# Patient Record
Sex: Female | Born: 1990 | Race: Black or African American | Hispanic: No | Marital: Single | Smoking: Never smoker
Health system: Southern US, Community
[De-identification: ages and names within clinical notes are randomized; demographics above are authoritative.]

---

## 2017-10-07 ENCOUNTER — Emergency Department (HOSPITAL_COMMUNITY)
Admission: EM | Admit: 2017-10-07 | Discharge: 2017-10-07 | Disposition: A | Discharge status: Home / Self Care | Payer: Self-pay | Attending: Emergency Medicine | Admitting: Emergency Medicine

## 2017-10-07 ENCOUNTER — Emergency Department (HOSPITAL_COMMUNITY): Payer: Self-pay

## 2017-10-07 ENCOUNTER — Encounter (HOSPITAL_COMMUNITY): Payer: Self-pay | Admitting: *Deleted

## 2017-10-07 DIAGNOSIS — R519 Headache, unspecified: Secondary | ICD-10-CM

## 2017-10-07 DIAGNOSIS — R51 Headache: Secondary | ICD-10-CM | POA: Insufficient documentation

## 2017-10-07 LAB — CBG MONITORING, ED: Glucose-Capillary: 98 mg/dL (ref 70–99)

## 2017-10-07 LAB — POC URINE PREG, ED: Preg Test, Ur: NEGATIVE

## 2017-10-07 MED ORDER — ONDANSETRON 4 MG PO TBDP: 4.0000 mg | ORAL_TABLET | Freq: Three times a day (TID) | ORAL | 0 refills | Status: DC | PRN

## 2017-10-07 MED ORDER — ONDANSETRON HCL 4 MG/2ML IJ SOLN
4.0000 mg | Freq: Once | INTRAMUSCULAR | Status: AC
  Administered 2017-10-07: 4 mg via INTRAVENOUS
  Filled 2017-10-07: qty 2

## 2017-10-07 MED ORDER — PROCHLORPERAZINE EDISYLATE 10 MG/2ML IJ SOLN
10.0000 mg | Freq: Once | INTRAMUSCULAR | Status: AC
  Administered 2017-10-07: 10 mg via INTRAVENOUS
  Filled 2017-10-07: qty 2

## 2017-10-07 MED ORDER — KETOROLAC TROMETHAMINE 15 MG/ML IJ SOLN
15.0000 mg | Freq: Once | INTRAMUSCULAR | Status: AC
  Administered 2017-10-07: 15 mg via INTRAVENOUS
  Filled 2017-10-07: qty 1

## 2017-10-07 MED ORDER — DIPHENHYDRAMINE HCL 50 MG/ML IJ SOLN
12.5000 mg | Freq: Once | INTRAMUSCULAR | Status: AC
  Administered 2017-10-07: 12.5 mg via INTRAVENOUS
  Filled 2017-10-07: qty 1

## 2017-10-07 MED ORDER — ACETAMINOPHEN 500 MG PO TABS
500.0000 mg | ORAL_TABLET | Freq: Once | ORAL | Status: DC
  Filled 2017-10-07: qty 1

## 2017-10-07 MED ORDER — ONDANSETRON 4 MG PO TBDP
4.0000 mg | ORAL_TABLET | Freq: Once | ORAL | Status: AC
  Administered 2017-10-07: 4 mg via ORAL
  Filled 2017-10-07: qty 1

## 2017-10-07 MED ORDER — SODIUM CHLORIDE 0.9 % IV BOLUS
1000.0000 mL | Freq: Once | INTRAVENOUS | Status: AC
  Administered 2017-10-07: 1000 mL via INTRAVENOUS

## 2017-10-07 NOTE — Discharge Instructions (Addendum)
Please return to the Emergency Department for any new or worsening symptoms or if your symptoms do not improve. Please be sure to follow up with your Primary Care Physician as soon as possible regarding your visit today. If you do not have a Primary Doctor please use the resources below to establish one. You may use the zofran as prescribed for nausea. Please call [facility] to follow-up for further evaluation of your headache. Please drink plenty of water and get plenty of rest to help with your headache.  You may also use over-the-counter anti-inflammatories such as Tylenol or ibuprofen as directed on the packaging to help with your pain.  Contact a health care provider if: Your symptoms are not helped by medicine. You have a headache that is different from the usual headache. You have nausea or you vomit. You have a fever. Get help right away if: Your headache becomes severe. You have repeated vomiting. You have a stiff neck. You have a loss of vision. You have problems with speech. You have pain in the eye or ear. You have muscular weakness or loss of muscle control. You lose your balance or have trouble walking. You feel faint or pass out. You have confusion.  RESOURCE GUIDE  Chronic Pain Problems: Contact [facility]  [phone] Patients need to be referred by their primary care doctor.  Insufficient Money for Medicine: Contact [employer]:  call "211" or [place] [phone].  No Primary Care Doctor: Call [facility]  [phone] - can help you locate a primary care doctor that  accepts your insurance, provides certain services, etc. Physician Referral Service- [phone]  Agencies that provide inexpensive medical care: [facility]  [facility]  [phone] [facility]  [phone] [facility]  [phone] Planned Parenthood  [phone] [facility]   [phone]  [place] Providers: [facility]- [address], [address]  [phone], Mon-Fri 9am-7pm, Sat 9am-1pm [place], [address] [place]  [place], [address] [place]  [place]- [address]  [place], [address], [phone]  Only accepts [place] patients after they have their name  applied to their card  Self Pay (no insurance) in [facility]: Sickle Cell Patients: Dr Kevan Ny, [facility]  [place], [facility]- [place]  [facility] [place]- [address], [facility]- see information above (Speak to [employer] if you do not have insurance)       -  [facility]- [place], [place],  [place]       -  [place], [place]  Dr Vista Lawman-  [address], [address], [place], [facility]- [address] [phone]       -  [facility] [place]- [address], [place], also [address] [phone]       -  [place], [place], 1st & 3rd Saturday   every month, 10am-1pm  1) Find a Doctor and Pay Out of Pocket Although you won't have to find out who is covered by your insurance plan, it is a good idea to ask around and get recommendations. You will then need to call the office and see if the doctor you have chosen will accept you as a new patient and what types of options they offer for patients who are self-pay. Some doctors offer discounts or will set up payment plans for their patients who do not have insurance, but you will need to ask so you aren't surprised when  you get to your appointment.  2) Contact Your Local Health Department Not all health departments have doctors that can see patients for sick visits, but many do, so it is worth a call to see if yours does. If you don't know where your local health department is, you can check in your phone book. The CDC also has a tool to help you locate your state's health department, and many state websites also have listings of all of their local health departments.  3) Find a [facility] If your illness is not likely to be very severe or complicated, you may want to try a walk in clinic. These are popping up all over the country in pharmacies, drugstores, and shopping centers. They're usually staffed by nurse practitioners or physician assistants that have been trained to treat common illnesses and complaints. They're usually fairly quick and inexpensive. However, if you have serious medical issues or chronic medical problems, these are probably not your best option  [facility] [place], [facility], [address] [place], phone [phone] or [phone].  Monday - Friday, call for an appointment. [place], [facility], [address], [place], phone [phone] or [phone].  Monday - Friday, call for an appointment.  Abuse/Neglect: [place] [phone] [place] [phone] (After Hours)  Emergency Shelter:  [facility] [phone]  Maternity Homes: [facility] [phone] [place] [phone]  MRSA Hotline #:   936-107-7084  [facility] Dept. [address]         [place] Phone:  [phone]                                  Phone:  [phone]                   Phone:  [place], [phone]       -  [facility] in [place], [address]                                  [place] [phone] or [phone] (After Hours)   [place]  Substance Abuse Resources: Alcohol and Drug Services  [place] [phone] [facility] [place] Chinita Pester [phone] Residential & Outpatient Substance Abuse Program  [phone]  Psychological Services: [place]  [phone] [place]  [place], [place] [address] [place], [place]: [phone] or [phone], [employer]  Dental Assistance  If unable to pay or uninsured, contact:  [facility] or [facility]. to become qualified for the adult dental clinic.  Patients with Medicaid: [facility] [phone] [address], [place] [address], [phone]  If unable to pay, or uninsured, contact [facility] [phone]) or [place] [phone] in [place], [place] in [facility]) to become qualified for the adult dental clinic   Other [place]- [place], [place], [place], [address], [place], [place], 2nd and 4th Thursday of the month at 6:30am.  10 clients each day by appointment, can sometimes see walk-in patients if someone does not show for an appointment. [facility]- [address] Hillard Danker [place], [place], [address], [place], [place], [place], [address],  [place] Department- [phone] [facility] [phone]

## 2017-10-07 NOTE — ED Triage Notes (Signed)
Pt in stating this morning while at work developed headache, blurred vision, photophobia and fatigue, alert and oriented, answering questions appropriately, pt did eat breakfast, no history of migraines in the past, pt also reports nausea, denies vomiting or diarrhea

## 2017-10-07 NOTE — ED Triage Notes (Signed)
Pt requesting medication for nausea 

## 2017-10-07 NOTE — ED Provider Notes (Signed)
[facility] EMERGENCY DEPARTMENT Provider Note   CSN: [id number] Arrival date & time: 10/07/17  6378     History   Chief Complaint Chief Complaint  Patient presents with  . Headache  . Vomiting    HPI Amanda Banks is a 27 y.o. female presenting for sudden onset that began at 7 AM this morning.  Patient describes her headache as a frontal sharp/throbbing pain that is 10/10 in severity and constant.  Patient states that she experienced a short episode of blurred vision when attempting to look at her friend's phone this morning that lasted less than 1 minute.  Patient endorses intermittent nausea and vomiting, vomited x2 today nonbloody nonbilious.  Patient also endorses right arm numbness that lasted less than 1 minute this morning. Patient states that she has had headaches in the past, no diagnosis of migraines.  Patient states that she has never had a headache this severe in the past.  Patient denies neck pain/stiffness, fever.  At time of evaluation patient only endorsing headache, denies numbness, tingling, weakness or vision abnormalities.  CT scan ordered by triage with no acute abnormalities.  HPI  History reviewed. No pertinent past medical history.  There are no active problems to display for this patient.   History reviewed. No pertinent surgical history.   OB History   None      Home Medications    Prior to Admission medications   Medication Sig Start Date End Date Taking? Authorizing Provider  ondansetron (ZOFRAN ODT) 4 MG disintegrating tablet Take 1 tablet (4 mg total) by mouth every 8 (eight) hours as needed for nausea or vomiting. 10/07/17   Deliah Boston, PA-C    Family History History reviewed. No pertinent family history.  Social History Social History   Tobacco Use  . Smoking status: Never Smoker  . Smokeless tobacco: Never Used  Substance Use Topics  . Alcohol use: Not on file  . Drug use: Not on file      Allergies   Patient has no known allergies.   Review of Systems Review of Systems  Constitutional: Negative.  Negative for chills, fatigue and fever.  Eyes: Positive for photophobia and visual disturbance. Negative for pain.  Gastrointestinal: Positive for nausea and vomiting. Negative for abdominal pain.  Musculoskeletal: Negative.  Negative for neck pain and neck stiffness.  Neurological: Positive for numbness and headaches. Negative for dizziness, syncope, speech difficulty and weakness.  All other systems reviewed and are negative.   Physical Exam Updated Vital Signs BP 117/66 (BP Location: Right Arm)   Pulse 74   Temp 97.8 F (36.6 C) (Oral)   Resp 20   SpO2 100%   Physical Exam  Constitutional: She is oriented to person, place, and time. She appears well-developed and well-nourished. She does not appear ill. No distress.  HENT:  Head: Normocephalic and atraumatic.  Right Ear: External ear normal.  Left Ear: External ear normal.  Nose: Nose normal.  Mouth/Throat: Oropharynx is clear and moist. No oropharyngeal exudate.  Eyes: Pupils are equal, round, and reactive to light. Conjunctivae and EOM are normal.  Neck: Trachea normal, normal range of motion and full passive range of motion without pain. Neck supple. No tracheal deviation present. No Brudzinski's sign noted.  Cardiovascular: Normal rate, regular rhythm, normal heart sounds and intact distal pulses.  Pulmonary/Chest: Effort normal and breath sounds normal. No respiratory distress.  Abdominal: Soft. Bowel sounds are normal. There is no tenderness. There is no rebound and no  guarding.  Musculoskeletal: Normal range of motion. She exhibits no tenderness.  Neurological: She is alert and oriented to person, place, and time. She has normal strength. No cranial nerve deficit or sensory deficit. She displays a negative Romberg sign. Coordination and gait normal. GCS eye subscore is 4. GCS verbal subscore is 5. GCS  motor subscore is 6.  Mental Status: Alert, oriented, thought content appropriate, able to give a coherent history. Speech fluent without evidence of aphasia. Able to follow 2 step commands without difficulty. Cranial Nerves: II: Peripheral visual fields grossly normal, pupils equal, round, reactive to light III,IV, VI: ptosis not present, extra-ocular motions intact bilaterally V,VII: smile symmetric, eyebrows raise symmetric, facial light touch sensation equal VIII: hearing grossly normal to voice X: uvula elevates symmetrically XI: bilateral shoulder shrug symmetric and strong XII: midline tongue extension without fassiculations Motor: Normal tone. 5/5 strength in upper and lower extremities bilaterally including strong and equal grip strength and dorsiflexion/plantar flexion Sensory: Sensation intact to light touch in all extremities.Negative Romberg.  Cerebellar: normal finger-to-nose with bilateral upper extremities. Normal heel-to -shin balance bilaterally of the lower extremity. No pronator drift.  Gait: normal gait and balance CV: distal pulses palpable throughout  Skin: Skin is warm and dry. Capillary refill takes less than 2 seconds.  Psychiatric: She has a normal mood and affect. Her behavior is normal.    ED Treatments / Results  Labs (all labs ordered are listed, but only abnormal results are displayed) Labs Reviewed  CBG MONITORING, ED  POC URINE PREG, ED    EKG None  Radiology Ct Head Wo Contrast  Result Date: 10/07/2017 CLINICAL DATA:  Headache and blurred vision. History of pituitary tumor. EXAM: CT HEAD WITHOUT CONTRAST TECHNIQUE: Contiguous axial images were obtained from the base of the skull through the vertex without intravenous contrast. COMPARISON:  None. FINDINGS: Brain: There is no evidence of acute infarct, intracranial hemorrhage, mass, midline shift, or extra-axial fluid collection. The ventricles and sulci are normal. The pituitary gland  itself is obscured by beam hardening, however no grossly expansile or destructive sellar mass is evident. Vascular: No hyperdense vessel. Skull: No fracture or focal osseous lesion. Sinuses/Orbits: Extensive bilateral ethmoid air cell opacification. Right greater than left sphenoid sinus mucosal thickening and secretions. Clear mastoid air cells. Unremarkable orbits. Other: None. IMPRESSION: 1. No evidence of acute intracranial abnormality. 2. Extensive bilateral ethmoid and sphenoid sinus inflammatory disease. Electronically Signed   By: Logan Bores M.D.   On: 10/07/2017 11:24    Procedures Procedures (including critical care time)  Medications Ordered in ED Medications  ondansetron (ZOFRAN-ODT) disintegrating tablet 4 mg (4 mg Oral Given 10/07/17 0904)  sodium chloride 0.9 % bolus 1,000 mL (0 mLs Intravenous Stopped 10/07/17 1447)  ketorolac (TORADOL) 15 MG/ML injection 15 mg (15 mg Intravenous Given 10/07/17 1333)  prochlorperazine (COMPAZINE) injection 10 mg (10 mg Intravenous Given 10/07/17 1334)  diphenhydrAMINE (BENADRYL) injection 12.5 mg (12.5 mg Intravenous Given 10/07/17 1336)  ondansetron (ZOFRAN) injection 4 mg (4 mg Intravenous Given 10/07/17 1613)     Initial Impression / Assessment and Plan / ED Course  I have reviewed the triage vital signs and the nursing notes.  Pertinent labs & imaging results that were available during my care of the patient were reviewed by me and considered in my medical decision making (see chart for details).  Clinical Course as of Oct 08 2023  Tue Oct 07, 2017  1455 On reevaluation patient sitting comfortably in bed no acute distress.  Patient states that her headache has improved at this time.   [BM]    Clinical Course User Index [BM] Deliah Boston, PA-C   Amanda Banks is a 27 y.o. female who presents to ED for headache. No focal neuro deficits on exam. Migraine cocktail and fluids given, headache treated and improved while in ED.    On  re-evaluation, patient denying pain, nausea or vomiting tolerating p.o. fluids without difficulty. The patient denies any neurologic symptoms such as visual changes, focal numbness/weakness, balance problems, confusion, or speech difficulty to suggest a life-threatening intracranial process such as intracranial hemorrhage or mass. The patient has no clotting risk factors thus venous sinus thrombosis is unlikely. No fevers, neck pain or nuchal rigidity to suggest meningitis. Patient is afebrile, non-toxic and well appearing. Reassuring neuro exam, normal gait in the hallway. No cranial deficits, no speech deficits, negative pronator drift, normal/equal strength to all extremities.   CT head without acute findings.  I feel that the patient is safe for discharge home at this time. PCP follow up strongly encouraged. I have reviewed return precautions including development of fever, nausea/vomiting or neurologic symptoms, vision changes, confusion, lethargy, difficulty speaking/walking, or other new/worsening/concerning symptoms. Patient states understanding of return precautions. Patient is agreeable to discharge. All questions answered.  Patient is also been given referral to [facility] neurology for further evaluation.  At this time there does not appear to be any evidence of an acute emergency medical condition and the patient appears stable for discharge with appropriate outpatient follow up. Diagnosis was discussed with patient who verbalizes understanding of care plan and is agreeable to discharge. I have discussed return precautions with patient and family at bedside who verbalize understanding of return precautions. Patient strongly encouraged to follow-up with their PCP. All questions answered.  Patient's case discussed with Dr. Rogene Houston who agrees with plan to discharge with follow-up.     Note: Portions of this report may have been transcribed using voice recognition software. Every effort was  made to ensure accuracy; however, inadvertent computerized transcription errors may still be present.    Final Clinical Impressions(s) / ED Diagnoses   Final diagnoses:  Bad headache    ED Discharge Orders         Ordered    ondansetron (ZOFRAN ODT) 4 MG disintegrating tablet  Every 8 hours PRN     10/07/17 1624           Gari Crown 10/07/17 2031    Fredia Sorrow, MD 10/12/17 [phone]

## 2017-10-07 NOTE — ED Notes (Signed)
Declined W/C at D/C and was escorted to lobby by RN. 

## 2017-10-07 NOTE — ED Triage Notes (Signed)
Pt took ibuprofen around 8am with no improvement, headache started at 7a, sudden onset and was severe from beginning

## 2019-07-27 ENCOUNTER — Ambulatory Visit
Admission: EM | Admit: 2019-07-27 | Discharge: 2019-07-27 | Disposition: A | Discharge status: Home / Self Care | Payer: No Typology Code available for payment source | Attending: Emergency Medicine | Admitting: Emergency Medicine

## 2019-07-27 DIAGNOSIS — R1031 Right lower quadrant pain: Secondary | ICD-10-CM | POA: Diagnosis present

## 2019-07-27 LAB — POCT URINALYSIS DIP (MANUAL ENTRY)
Bilirubin, UA: NEGATIVE
Glucose, UA: NEGATIVE mg/dL
Ketones, POC UA: NEGATIVE mg/dL
Leukocytes, UA: NEGATIVE
Nitrite, UA: NEGATIVE
Protein Ur, POC: NEGATIVE mg/dL
Spec Grav, UA: 1.02 (ref 1.010–1.025)
Urobilinogen, UA: 0.2 E.U./dL
pH, UA: 5 (ref 5.0–8.0)

## 2019-07-27 LAB — POCT URINE PREGNANCY: Preg Test, Ur: NEGATIVE

## 2019-07-27 NOTE — ED Provider Notes (Signed)
[facility]    CSN: [id number] Arrival date & time: 07/27/19  1205      History   Chief Complaint Chief Complaint  Patient presents with  . Abdominal Pain    RLQ    HPI Amanda Banks is a 29 y.o. female.   Patient presents with 2-week history of right lower quadrant pain.  She states that the pain is intermittent; no aggravating or alleviating factors; 5/10; sharp.  She also reports a small amount of clear thin vaginal discharge x several weeks.  She denies fever, chills, shortness of breath, nausea, vomiting, diarrhea, constipation, change in appetite, pelvic pain, rash, lesions, or other symptoms.  No treatments attempted.  The history is provided by the patient.    History reviewed. No pertinent past medical history.  There are no problems to display for this patient.   History reviewed. No pertinent surgical history.  OB History   No obstetric history on file.      Home Medications    Prior to Admission medications   Medication Sig Start Date End Date Taking? Authorizing Provider  ondansetron (ZOFRAN ODT) 4 MG disintegrating tablet Take 1 tablet (4 mg total) by mouth every 8 (eight) hours as needed for nausea or vomiting. 10/07/17   Deliah Boston, PA-C    Family History History reviewed. No pertinent family history.  Social History Social History   Tobacco Use  . Smoking status: Never Smoker  . Smokeless tobacco: Never Used  Substance Use Topics  . Alcohol use: Not on file  . Drug use: Not on file     Allergies   Patient has no known allergies.   Review of Systems Review of Systems  Constitutional: Negative for chills and fever.  HENT: Negative for ear pain and sore throat.   Eyes: Negative for pain and visual disturbance.  Respiratory: Negative for cough and shortness of breath.   Cardiovascular: Negative for chest pain and palpitations.  Gastrointestinal: Positive for abdominal pain. Negative for constipation, diarrhea, nausea  and vomiting.  Genitourinary: Positive for vaginal discharge. Negative for dysuria, flank pain, frequency, hematuria and urgency.  Musculoskeletal: Negative for arthralgias and back pain.  Skin: Negative for color change and rash.  Neurological: Negative for seizures and syncope.  All other systems reviewed and are negative.    Physical Exam Triage Vital Signs ED Triage Vitals  Enc Vitals Group     BP      Pulse      Resp      Temp      Temp src      SpO2      Weight      Height      Head Circumference      Peak Flow      Pain Score      Pain Loc      Pain Edu?      Excl. in [place]?    No data found.  Updated Vital Signs BP 120/76   Pulse (!) 102   Temp 99.7 F (37.6 C)   Resp 16   LMP 06/12/2019 (Exact Date)   SpO2 100%   Visual Acuity Right Eye Distance:   Left Eye Distance:   Bilateral Distance:    Right Eye Near:   Left Eye Near:    Bilateral Near:     Physical Exam Vitals and nursing note reviewed.  Constitutional:      General: She is not in acute distress.    Appearance:  She is well-developed. She is not ill-appearing.  HENT:     Head: Normocephalic and atraumatic.     Mouth/Throat:     Mouth: Mucous membranes are moist.     Pharynx: Oropharynx is clear.  Eyes:     Conjunctiva/sclera: Conjunctivae normal.  Cardiovascular:     Rate and Rhythm: Normal rate and regular rhythm.     Heart sounds: No murmur heard.   Pulmonary:     Effort: Pulmonary effort is normal. No respiratory distress.     Breath sounds: Normal breath sounds.  Abdominal:     General: Bowel sounds are normal. There is no distension.     Palpations: Abdomen is soft.     Tenderness: There is no abdominal tenderness. There is no right CVA tenderness, left CVA tenderness, guarding or rebound.  Musculoskeletal:     Cervical back: Neck supple.     Right lower leg: No edema.     Left lower leg: No edema.  Skin:    General: Skin is warm and dry.     Findings: No rash.    Neurological:     General: No focal deficit present.     Mental Status: She is alert and oriented to person, place, and time.     Gait: Gait normal.  Psychiatric:        Mood and Affect: Mood normal.        Behavior: Behavior normal.      UC Treatments / Results  Labs (all labs ordered are listed, but only abnormal results are displayed) Labs Reviewed  POCT URINALYSIS DIP (MANUAL ENTRY) - Abnormal; Notable for the following components:      Result Value   Blood, UA trace-intact (*)    All other components within normal limits  CBC  COMPREHENSIVE METABOLIC PANEL  POCT URINE PREGNANCY  CERVICOVAGINAL ANCILLARY ONLY    EKG   Radiology No results found.  Procedures Procedures (including critical care time)  Medications Ordered in UC Medications - No data to display  Initial Impression / Assessment and Plan / UC Course  I have reviewed the triage vital signs and the nursing notes.  Pertinent labs & imaging results that were available during my care of the patient were reviewed by me and considered in my medical decision making (see chart for details).   Right lower quadrant abdominal pain.  Discussed limitations of UC versus ED.  Patient declines transfer to the ED at this time.  She is well-appearing and her exam is reassuring.  CBC and CMP pending.  Urine negative for nitrites and LE.  Urine pregnancy negative.  Strict precautions for going to the ED discussed with patient.  Instructed her to follow-up with her PCP as soon as possible.  Patient agrees to plan of care.   Final Clinical Impressions(s) / UC Diagnoses   Final diagnoses:  RLQ abdominal pain     Discharge Instructions     Go to the emergency department if you have acute abdominal pain.    Your lab tests are pending.  We will call you with the results.    Your vaginal test are pending.  Do not have sex until the test results are back.  You may require treatment at that time.    Follow-up with your  primary care provider.        ED Prescriptions    None     PDMP not reviewed this encounter.   Sharion Balloon, NP 07/27/19 1255

## 2019-07-27 NOTE — Discharge Instructions (Signed)
Go to the emergency department if you have acute abdominal pain.    Your lab tests are pending.  We will call you with the results.    Your vaginal test are pending.  Do not have sex until the test results are back.  You may require treatment at that time.    Follow-up with your primary care provider.

## 2019-07-27 NOTE — ED Triage Notes (Signed)
Patient reports throbbing RLQ pain x2 week. Pain is intermittent; states nothing makes it better or worse.

## 2019-07-28 ENCOUNTER — Telehealth (HOSPITAL_COMMUNITY): Payer: Self-pay | Admitting: Orthopedic Surgery

## 2019-07-28 LAB — COMPREHENSIVE METABOLIC PANEL
ALT: 13 IU/L (ref 0–32)
AST: 22 IU/L (ref 0–40)
Albumin/Globulin Ratio: 1.7 (ref 1.2–2.2)
Albumin: 4.7 g/dL (ref 3.9–5.0)
Alkaline Phosphatase: 112 IU/L (ref 48–121)
BUN/Creatinine Ratio: 11 (ref 9–23)
BUN: 9 mg/dL (ref 6–20)
Bilirubin Total: 0.3 mg/dL (ref 0.0–1.2)
CO2: 21 mmol/L (ref 20–29)
Calcium: 9.2 mg/dL (ref 8.7–10.2)
Chloride: 106 mmol/L (ref 96–106)
Creatinine, Ser: 0.83 mg/dL (ref 0.57–1.00)
GFR calc Af Amer: 113 mL/min/{1.73_m2} (ref >59)
GFR calc non Af Amer: 98 mL/min/{1.73_m2} (ref >59)
Globulin, Total: 2.8 g/dL (ref 1.5–4.5)
Glucose: 83 mg/dL (ref 65–99)
Potassium: 4.2 mmol/L (ref 3.5–5.2)
Sodium: 139 mmol/L (ref 134–144)
Total Protein: 7.5 g/dL (ref 6.0–8.5)

## 2019-07-28 LAB — CERVICOVAGINAL ANCILLARY ONLY
Bacterial Vaginitis (gardnerella): POSITIVE — AB
Candida Glabrata: NEGATIVE
Candida Vaginitis: NEGATIVE
Chlamydia: NEGATIVE
Comment: NEGATIVE
Comment: NEGATIVE
Comment: NEGATIVE
Comment: NEGATIVE
Comment: NEGATIVE
Comment: NORMAL
Neisseria Gonorrhea: NEGATIVE
Trichomonas: POSITIVE — AB

## 2019-07-28 LAB — CBC
Hematocrit: 41.9 % (ref 34.0–46.6)
Hemoglobin: 13.9 g/dL (ref 11.1–15.9)
MCH: 26.8 pg (ref 26.6–33.0)
MCHC: 33.2 g/dL (ref 31.5–35.7)
MCV: 81 fL (ref 79–97)
Platelets: 248 10*3/uL (ref 150–450)
RBC: 5.18 x10E6/uL (ref 3.77–5.28)
RDW: 14.9 % (ref 11.7–15.4)
WBC: 6.1 10*3/uL (ref 3.4–10.8)

## 2019-07-28 MED ORDER — METRONIDAZOLE 500 MG PO TABS: 500.0000 mg | ORAL_TABLET | Freq: Two times a day (BID) | ORAL | 0 refills | Status: DC

## 2019-07-29 ENCOUNTER — Telehealth: Payer: Self-pay | Admitting: Emergency Medicine

## 2019-07-29 NOTE — Telephone Encounter (Signed)
Discussed lab results and treatment with patient via phone call.  She agrees to plan of care and will have her sexual partner seek treatment also.

## 2019-09-28 IMAGING — CT CT HEAD W/O CM
4 series · 17 of 47 positions shown, 19 images · non-contrast
Comparison: None.

CLINICAL DATA: Headache and blurred vision. History of pituitary
tumor.

EXAM:
CT HEAD WITHOUT CONTRAST
TECHNIQUE: Contiguous axial images were obtained from the base of the skull
through the vertex without intravenous contrast.

[Series 3: head without · axial · non-contrast · 0.49mm/px · z∈[+1229,+1349]mm · 7 of 32 slices shown, 9 images]
[im 4/32  brain]
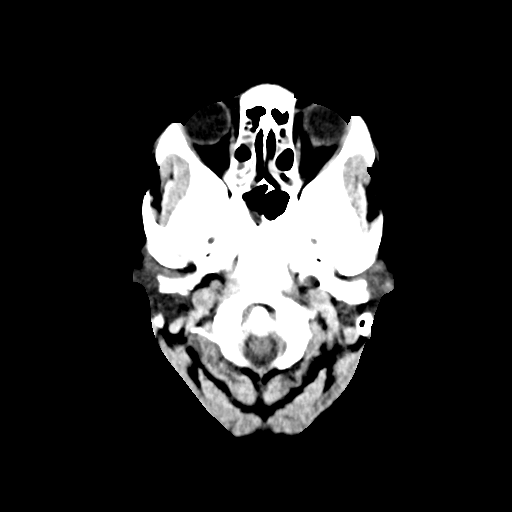
[im 4/32  bone]
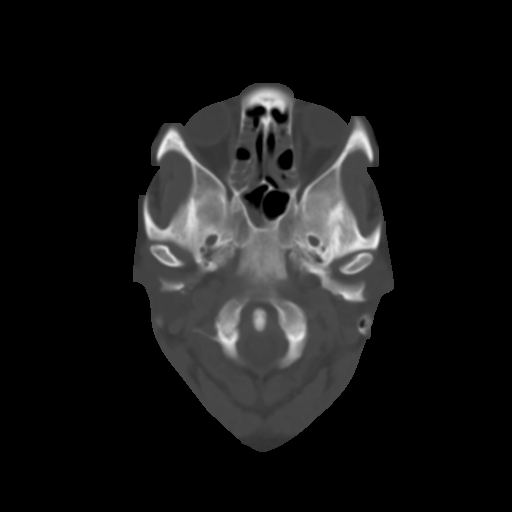
[im 8/32  brain]
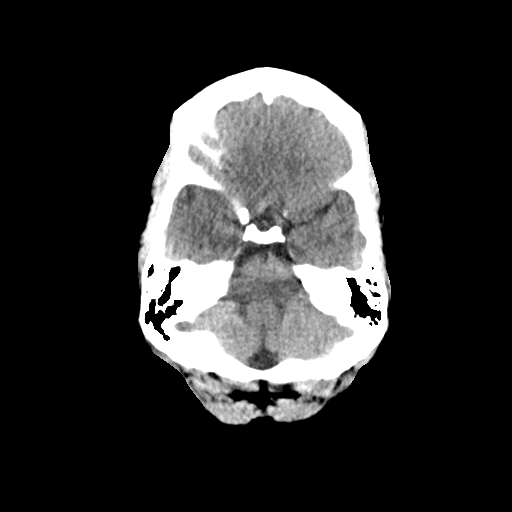
[im 12/32  brain]
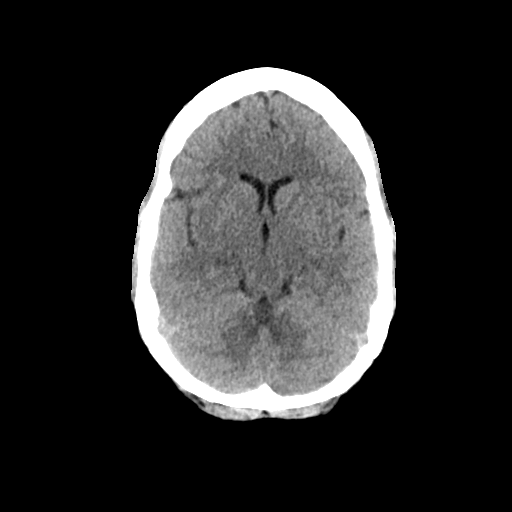
[im 16/32  brain]
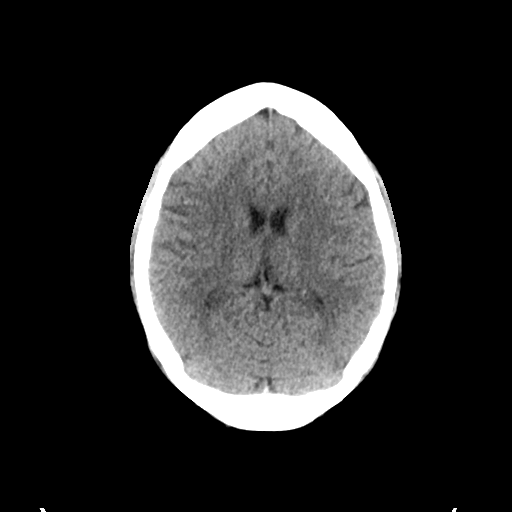
[im 20/32  brain]
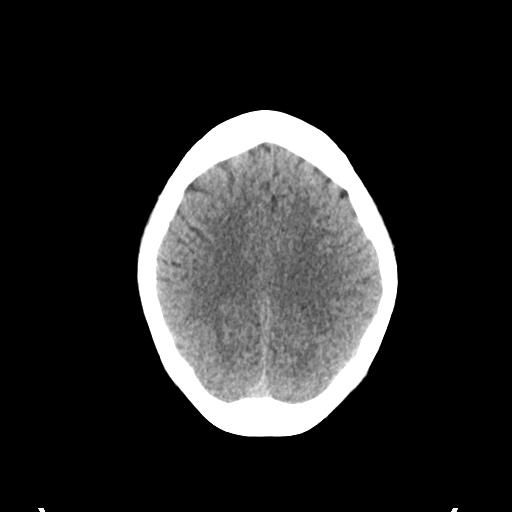
[im 20/32  bone]
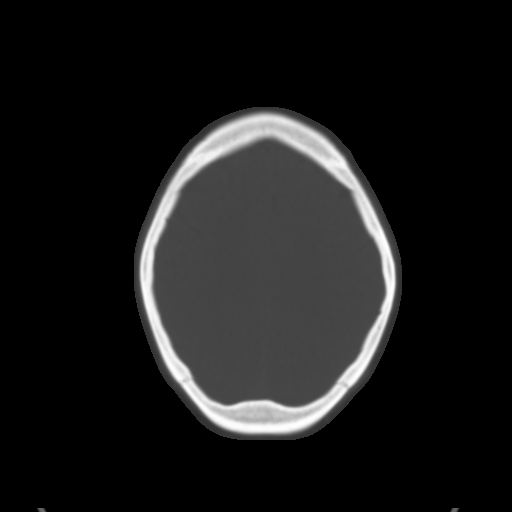
[im 24/32  brain]
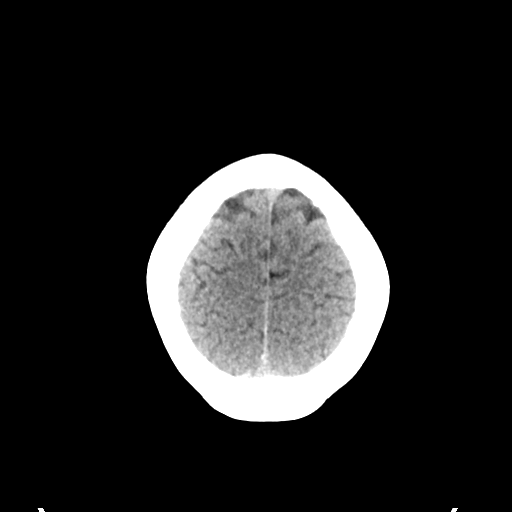
[im 28/32  brain]
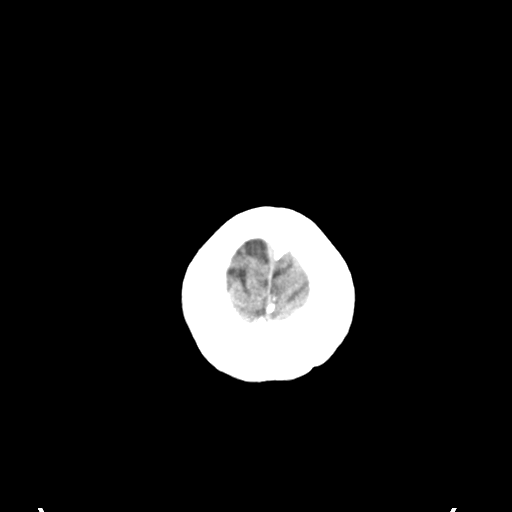

[Series 4: head bone · axial · 0.49mm/px · z∈[+1228,+1284]mm · 4 of 79 slices shown]
[im 8/79  bone]
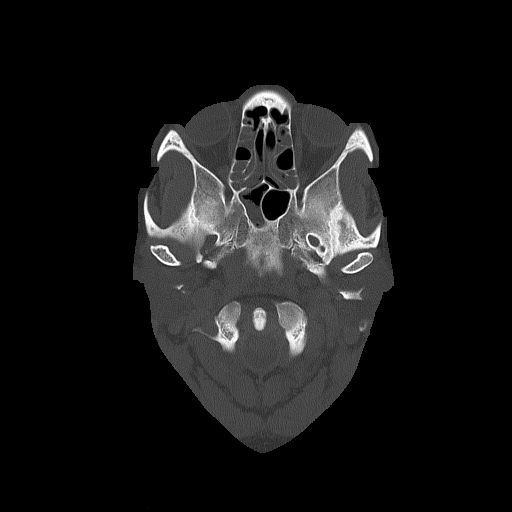
[im 16/79  bone]
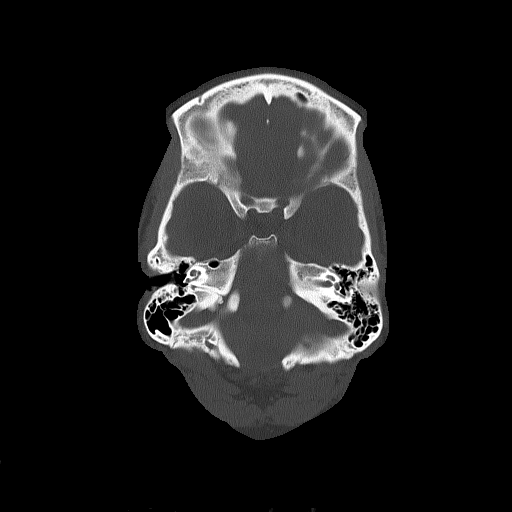
[im 24/79  bone]
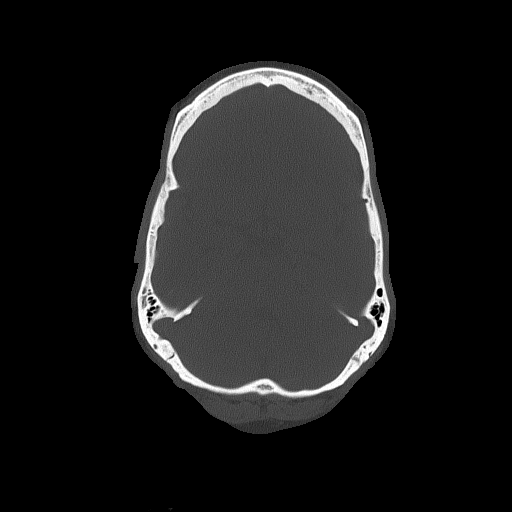
[im 36/79  bone]
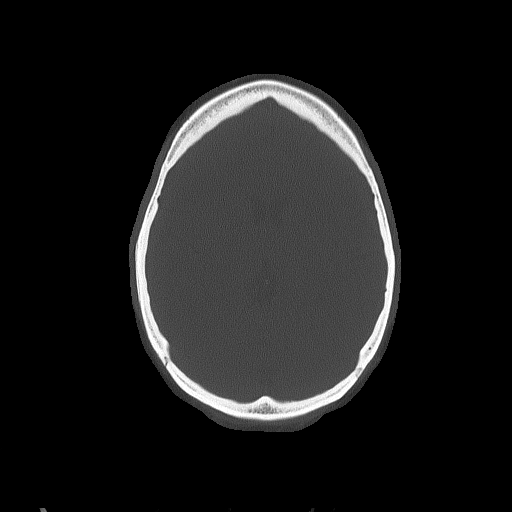

[Series 5: head without cor · coronal · non-contrast · 0.32mm/px · 3 of 73 slices shown]
[im 25/73  brain]
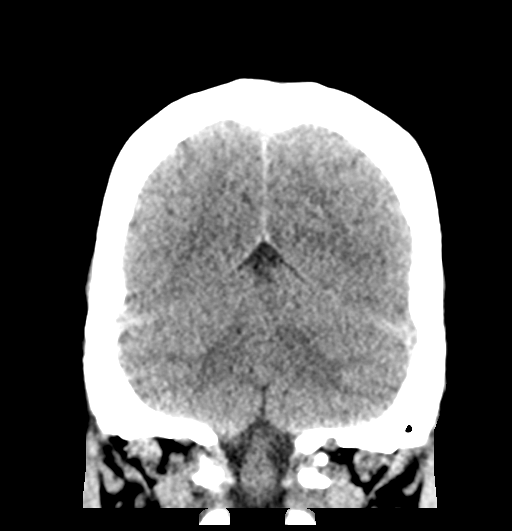
[im 33/73  brain]
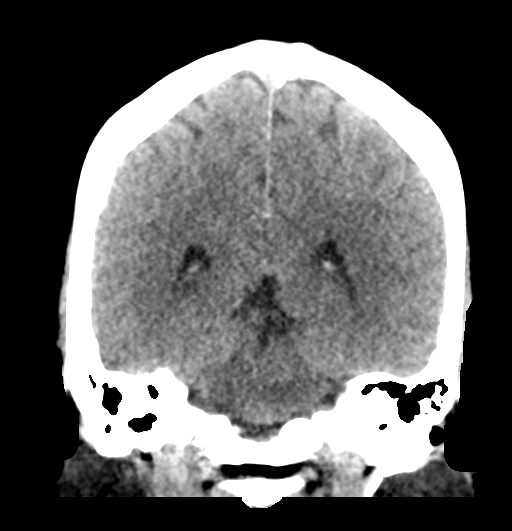
[im 41/73  brain]
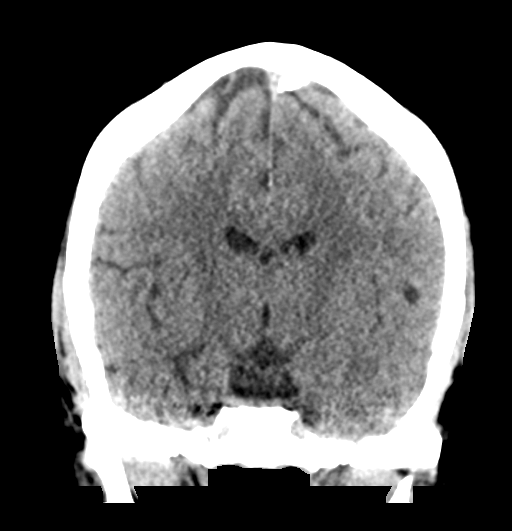

[Series 6: head without sag · sagittal · non-contrast · 0.34mm/px · 3 of 53 slices shown]
[im 18/53  brain]
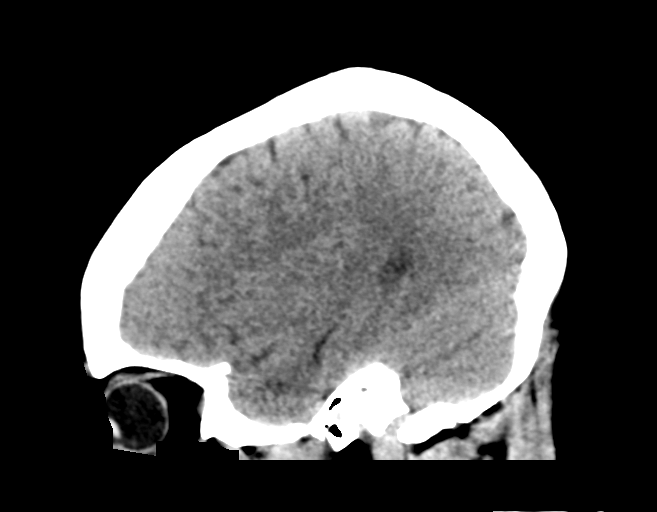
[im 27/53  brain]
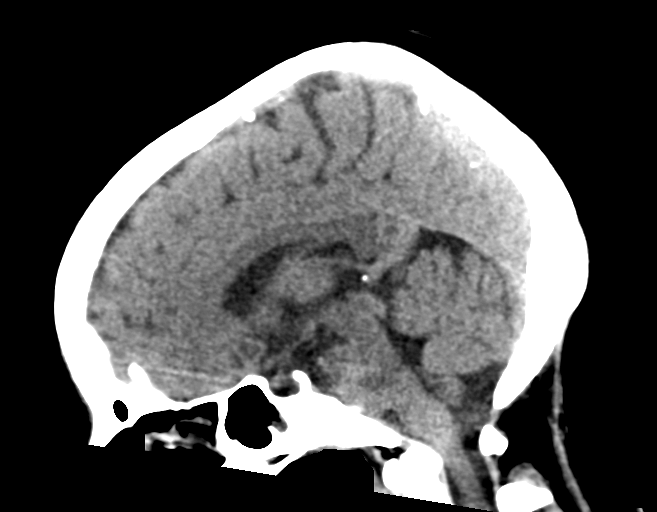
[im 35/53  brain]
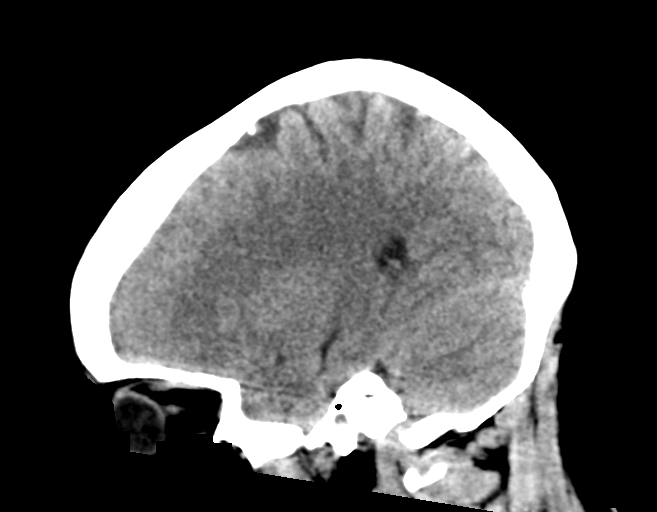

[17 of 47 positions shown; findings below may reference images not displayed]

FINDINGS: Brain: There is no evidence of acute infarct, intracranial
hemorrhage, mass, midline shift, or extra-axial fluid collection.
The ventricles and sulci are normal. The pituitary gland itself is
obscured by beam hardening, however no grossly expansile or
destructive sellar mass is evident.

Vascular: No hyperdense vessel.

Skull: No fracture or focal osseous lesion.

Sinuses/Orbits: Extensive bilateral ethmoid air cell opacification.
Right greater than left sphenoid sinus mucosal thickening and
secretions. Clear mastoid air cells. Unremarkable orbits.

Other: None.
IMPRESSION: 1. No evidence of acute intracranial abnormality.
2. Extensive bilateral ethmoid and sphenoid sinus inflammatory
disease.

## 2020-09-05 ENCOUNTER — Telehealth: Payer: No Typology Code available for payment source | Admitting: Nurse Practitioner

## 2020-09-05 DIAGNOSIS — N760 Acute vaginitis: Secondary | ICD-10-CM

## 2020-09-05 DIAGNOSIS — B9689 Other specified bacterial agents as the cause of diseases classified elsewhere: Secondary | ICD-10-CM | POA: Diagnosis not present

## 2020-09-05 MED ORDER — METRONIDAZOLE 0.75 % VA GEL: 1.0000 | Freq: Two times a day (BID) | VAGINAL | 0 refills | Status: DC

## 2020-09-05 NOTE — Progress Notes (Signed)
E-Visit for Vaginal Symptoms  We are sorry that you are not feeling well. Here is how we plan to help! Based on what you shared with me it looks like you: May have a vaginosis due to bacteria  Vaginosis is an inflammation of the vagina that can result in discharge, itching and pain. The cause is usually a change in the normal balance of vaginal bacteria or an infection. Vaginosis can also result from reduced estrogen levels after menopause.  The most common causes of vaginosis are:   Bacterial vaginosis which results from an overgrowth of one on several organisms that are normally present in your vagina.   Yeast infections which are caused by a naturally occurring fungus called candida.   Vaginal atrophy (atrophic vaginosis) which results from the thinning of the vagina from reduced estrogen levels after menopause.   Trichomoniasis which is caused by a parasite and is commonly transmitted by sexual intercourse.  Factors that increase your risk of developing vaginosis include: Medications, such as antibiotics and steroids Uncontrolled diabetes Use of hygiene products such as bubble bath, vaginal spray or vaginal deodorant Douching Wearing damp or tight-fitting clothing Using an intrauterine device (IUD) for birth control Hormonal changes, such as those associated with pregnancy, birth control pills or menopause Sexual activity Having a sexually transmitted infection  Your treatment plan is metrinidazole vaginal cream bid for 5 days  Be sure to take all of the medication as directed. Stop taking any medication if you develop a rash, tongue swelling or shortness of breath. Mothers who are breast feeding should consider pumping and discarding their breast milk while on these antibiotics. However, there is no consensus that infant exposure at these doses would be harmful.  Remember that medication creams can weaken latex condoms. Marland Kitchen   HOME CARE:  Good hygiene may prevent some types of  vaginosis from recurring and may relieve some symptoms:  Avoid baths, hot tubs and whirlpool spas. Rinse soap from your outer genital area after a shower, and dry the area well to prevent irritation. Don't use scented or harsh soaps, such as those with deodorant or antibacterial action. Avoid irritants. These include scented tampons and pads. Wipe from front to back after using the toilet. Doing so avoids spreading fecal bacteria to your vagina.  Other things that may help prevent vaginosis include:  Don't douche. Your vagina doesn't require cleansing other than normal bathing. Repetitive douching disrupts the normal organisms that reside in the vagina and can actually increase your risk of vaginal infection. Douching won't clear up a vaginal infection. Use a latex condom. Both female and female latex condoms may help you avoid infections spread by sexual contact. Wear cotton underwear. Also wear pantyhose with a cotton crotch. If you feel comfortable without it, skip wearing underwear to bed. Yeast thrives in [employer] Your symptoms should improve in the next day or two.  GET HELP RIGHT AWAY IF:  You have pain in your lower abdomen ( pelvic area or over your ovaries) You develop nausea or vomiting You develop a fever Your discharge changes or worsens You have persistent pain with intercourse You develop shortness of breath, a rapid pulse, or you faint.  These symptoms could be signs of problems or infections that need to be evaluated by a medical provider now.  MAKE SURE YOU   Understand these instructions. Will watch your condition. Will get help right away if you are not doing well or get worse.  Thank you for choosing an e-visit.  Your e-visit answers were reviewed by a board certified advanced clinical practitioner to complete your personal care plan. Depending upon the condition, your plan could have included both over the counter or prescription medications.  Please  review your pharmacy choice. Make sure the pharmacy is open so you can pick up prescription now. If there is a problem, you may contact your provider through [employer] and have the prescription routed to another pharmacy.  Your safety is important to [place]. If you have drug allergies check your prescription carefully.   For the next 24 hours you can use MyChart to ask questions about today's visit, request a non-urgent call back, or ask for a work or school excuse. You will get an email in the next two days asking about your experience. I hope that your e-visit has been valuable and will speed your recovery.  5-10 minutes spent reviewing and documenting in chart.

## 2021-01-04 ENCOUNTER — Encounter: Payer: Self-pay | Admitting: Emergency Medicine

## 2021-01-04 ENCOUNTER — Emergency Department
Admission: EM | Admit: 2021-01-04 | Discharge: 2021-01-04 | Disposition: A | Discharge status: Home / Self Care | Payer: No Typology Code available for payment source | Attending: Emergency Medicine | Admitting: Emergency Medicine

## 2021-01-04 ENCOUNTER — Other Ambulatory Visit: Payer: Self-pay

## 2021-01-04 ENCOUNTER — Emergency Department: Payer: No Typology Code available for payment source

## 2021-01-04 DIAGNOSIS — J101 Influenza due to other identified influenza virus with other respiratory manifestations: Secondary | ICD-10-CM | POA: Insufficient documentation

## 2021-01-04 DIAGNOSIS — S299XXA Unspecified injury of thorax, initial encounter: Secondary | ICD-10-CM | POA: Diagnosis present

## 2021-01-04 DIAGNOSIS — Y92410 Unspecified street and highway as the place of occurrence of the external cause: Secondary | ICD-10-CM | POA: Insufficient documentation

## 2021-01-04 DIAGNOSIS — S29012A Strain of muscle and tendon of back wall of thorax, initial encounter: Secondary | ICD-10-CM | POA: Insufficient documentation

## 2021-01-04 DIAGNOSIS — Z20822 Contact with and (suspected) exposure to covid-19: Secondary | ICD-10-CM | POA: Diagnosis not present

## 2021-01-04 LAB — RESP PANEL BY RT-PCR (FLU A&B, COVID) ARPGX2
Influenza A by PCR: POSITIVE — AB
Influenza B by PCR: NEGATIVE
SARS Coronavirus 2 by RT PCR: NEGATIVE

## 2021-01-04 MED ORDER — MELOXICAM 7.5 MG PO TABS
15.0000 mg | ORAL_TABLET | Freq: Once | ORAL | Status: AC
  Administered 2021-01-04: 15 mg via ORAL
  Filled 2021-01-04: qty 2

## 2021-01-04 MED ORDER — BENZONATATE 100 MG PO CAPS: 100.0000 mg | ORAL_CAPSULE | Freq: Three times a day (TID) | ORAL | 0 refills | Status: DC | PRN

## 2021-01-04 MED ORDER — FLUTICASONE PROPIONATE 50 MCG/ACT NA SUSP: 1.0000 | Freq: Two times a day (BID) | NASAL | 0 refills | Status: DC

## 2021-01-04 MED ORDER — METHOCARBAMOL 500 MG PO TABS: 500.0000 mg | ORAL_TABLET | Freq: Four times a day (QID) | ORAL | 0 refills | Status: DC

## 2021-01-04 MED ORDER — PSEUDOEPH-BROMPHEN-DM 30-2-10 MG/5ML PO SYRP: 10.0000 mL | ORAL_SOLUTION | Freq: Four times a day (QID) | ORAL | 0 refills | Status: DC | PRN

## 2021-01-04 MED ORDER — MELOXICAM 15 MG PO TABS: 15.0000 mg | ORAL_TABLET | Freq: Every day | ORAL | 0 refills | Status: DC
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
## 2021-01-23 ENCOUNTER — Other Ambulatory Visit: Payer: Self-pay

## 2021-01-23 ENCOUNTER — Encounter: Payer: Self-pay | Admitting: Nurse Practitioner

## 2021-01-23 ENCOUNTER — Ambulatory Visit (INDEPENDENT_AMBULATORY_CARE_PROVIDER_SITE_OTHER): Payer: No Typology Code available for payment source | Admitting: Nurse Practitioner

## 2021-01-23 VITALS — BP 107/69 | HR 74 | Temp 98.2°F | Ht 65.0 in | Wt 186.4 lb

## 2021-01-23 DIAGNOSIS — R7301 Impaired fasting glucose: Secondary | ICD-10-CM | POA: Insufficient documentation

## 2021-01-23 DIAGNOSIS — D352 Benign neoplasm of pituitary gland: Secondary | ICD-10-CM | POA: Insufficient documentation

## 2021-01-23 DIAGNOSIS — Z7689 Persons encountering health services in other specified circumstances: Secondary | ICD-10-CM | POA: Diagnosis not present

## 2021-01-23 DIAGNOSIS — Z0000 Encounter for general adult medical examination without abnormal findings: Secondary | ICD-10-CM | POA: Diagnosis not present

## 2021-01-23 DIAGNOSIS — Z6833 Body mass index (BMI) 33.0-33.9, adult: Secondary | ICD-10-CM | POA: Insufficient documentation

## 2021-01-23 DIAGNOSIS — Z6831 Body mass index (BMI) 31.0-31.9, adult: Secondary | ICD-10-CM | POA: Insufficient documentation

## 2021-01-23 NOTE — Patient Instructions (Signed)
Fat and Cholesterol Restricted Eating Plan Getting too much fat and cholesterol in your diet may cause health problems. Choosing the right foods helps keep your fat and cholesterol at normal levels. This can keep you from getting certain diseases. Your doctor may recommend an eating plan that includes: Total fat: ______% or less of total calories a day. This is ______g of fat a day. Saturated fat: ______% or less of total calories a day. This is ______g of saturated fat a day. Cholesterol: less than _________mg a day. Fiber: ______g a day. What are tips for following this plan? General tips Work with your doctor to lose weight if you need to. Avoid: Foods with added sugar. Fried foods. Foods with trans fat or partially hydrogenated oils. This includes some margarines and baked goods. If you drink alcohol: Limit how much you have to: 0-1 drink a day for women who are not pregnant. 0-2 drinks a day for men. Know how much alcohol is in a drink. In the [place] one drink equals one 12 oz bottle of beer (355 mL), one 5 oz glass of wine (148 mL), or one 1 oz glass of hard liquor (44 mL). Reading food labels Check food labels for: Trans fats. Partially hydrogenated oils. Saturated fat (g) in each serving. Cholesterol (mg) in each serving. Fiber (g) in each serving. Choose foods with healthy fats, such as: Monounsaturated fats and polyunsaturated fats. These include olive and canola oil, flaxseeds, walnuts, almonds, and seeds. Omega-3 fats. These are found in certain fish, flaxseed oil, and ground flaxseeds. Choose grain products that have whole grains. Look for the word "whole" as the first word in the ingredient list. Cooking Cook foods using low-fat methods. These include baking, boiling, grilling, and broiling. Eat more home-cooked foods. Eat at restaurants and buffets less often. Eat less fast food. Avoid cooking using saturated fats, such as butter, cream, palm oil, palm kernel oil, and  coconut oil. Meal planning  At meals, divide your plate into four equal parts: Fill one-half of your plate with vegetables, green salads, and fruit. Fill one-fourth of your plate with whole grains. Fill one-fourth of your plate with low-fat (lean) protein foods. Eat fish that is high in omega-3 fats at least two times a week. This includes mackerel, tuna, sardines, and salmon. Eat foods that are high in fiber, such as whole grains, beans, apples, pears, berries, broccoli, carrots, peas, and barley. What foods should I eat? Fruits All fresh, canned (in natural juice), or frozen fruits. Vegetables Fresh or frozen vegetables (raw, steamed, roasted, or grilled). Green salads. Grains Whole grains, such as whole wheat or whole grain breads, crackers, cereals, and pasta. Unsweetened oatmeal, bulgur, barley, quinoa, or brown rice. Corn or whole wheat flour tortillas. Meats and other protein foods Ground beef (85% or leaner), grass-fed beef, or beef trimmed of fat. Skinless chicken or turkey. Ground chicken or turkey. Pork trimmed of fat. All fish and seafood. Egg whites. Dried beans, peas, or lentils. Unsalted nuts or seeds. Unsalted canned beans. Nut butters without added sugar or oil. Dairy Low-fat or nonfat dairy products, such as skim or 1% milk, 2% or reduced-fat cheeses, low-fat and fat-free ricotta or cottage cheese, or plain low-fat and nonfat yogurt. Fats and oils Tub margarine without trans fats. Light or reduced-fat mayonnaise and salad dressings. Avocado. Olive, canola, sesame, or safflower oils. The items listed above may not be a complete list of foods and beverages you can eat. Contact a dietitian for more information. What foods   should I avoid? Fruits Canned fruit in heavy syrup. Fruit in cream or butter sauce. Fried fruit. Vegetables Vegetables cooked in cheese, cream, or butter sauce. Fried vegetables. Grains White bread. White pasta. White rice. Cornbread. Bagels, pastries,  and croissants. Crackers and snack foods that contain trans fat and hydrogenated oils. Meats and other protein foods Fatty cuts of meat. Ribs, chicken wings, bacon, sausage, bologna, salami, chitterlings, fatback, hot dogs, bratwurst, and packaged lunch meats. Liver and organ meats. Whole eggs and egg yolks. Chicken and turkey with skin. Fried meat. Dairy Whole or 2% milk, cream, half-and-half, and cream cheese. Whole milk cheeses. Whole-fat or sweetened yogurt. Full-fat cheeses. Nondairy creamers and whipped toppings. Processed cheese, cheese spreads, and cheese curds. Fats and oils Butter, stick margarine, lard, shortening, ghee, or bacon fat. Coconut, palm kernel, and palm oils. Beverages Alcohol. Sugar-sweetened drinks such as sodas, lemonade, and fruit drinks. Sweets and desserts Corn syrup, sugars, honey, and molasses. Candy. Jam and jelly. Syrup. Sweetened cereals. Cookies, pies, cakes, donuts, muffins, and ice cream. The items listed above may not be a complete list of foods and beverages you should avoid. Contact a dietitian for more information. Summary Choosing the right foods helps keep your fat and cholesterol at normal levels. This can keep you from getting certain diseases. At meals, fill one-half of your plate with vegetables, green salads, and fruits. Eat high fiber foods, like whole grains, beans, apples, pears, berries, carrots, peas, and barley. Limit added sugar, saturated fats, alcohol, and fried foods. This information is not intended to replace advice given to you by your health care provider. Make sure you discuss any questions you have with your health care provider. Document Revised: 05/26/2020 Document Reviewed: 05/26/2020 Elsevier Patient Education  2022 [employer]  

## 2021-01-23 NOTE — Progress Notes (Signed)
New Patient Office Visit  Subjective:  Patient ID: Amanda Banks, female    DOB: 08-21-1990  Age: 30 y.o. MRN: [id number]  CC:  Chief Complaint  Patient presents with   New Patient (Initial Visit)    HPI Amanda Banks presents to establish new primary care provider. She moved to the area from [place] in 2019. She did keep providers there for a little while, but ready to establish care with someone here. She does have history of tumor of the anterior pituitary gland. It was causing her to secrete breast milk. Prolactin levels were very high. She did take medication for some time but has stopped. No longer having problems with breast mild secretion. She has not had further imaging studies. States that it has been some time. Now starting to get intermittent headaches. She is unsure if this is related to the tumor or if it is just randome headaches. She has not had labs checked in nearly tw oyears.  She has no other concerns or complaints today.   History reviewed. No pertinent past medical history.  History reviewed. No pertinent surgical history.  History reviewed. No pertinent family history.  Social History   Socioeconomic History   Marital status: Single    Spouse name: Not on file   Number of children: Not on file   Years of education: Not on file   Highest education level: Not on file  Occupational History   Not on file  Tobacco Use   Smoking status: Never   Smokeless tobacco: Never  Substance and Sexual Activity   Alcohol use: Not on file   Drug use: Not on file   Sexual activity: Not on file  Other Topics Concern   Not on file  Social History Narrative   Not on file   Social Determinants of Health   Financial Resource Strain: Not on file  Food Insecurity: Not on file  Transportation Needs: Not on file  Physical Activity: Not on file  Stress: Not on file  Social Connections: Not on file  Intimate Partner Violence: Not on file    ROS Review of Systems   Constitutional:  Negative for activity change, appetite change, chills, fatigue and fever.  HENT:  Negative for congestion, postnasal drip, rhinorrhea, sinus pressure, sinus pain, sneezing and sore throat.   Eyes: Negative.   Respiratory:  Negative for cough, chest tightness, shortness of breath and wheezing.   Cardiovascular:  Negative for chest pain and palpitations.  Gastrointestinal:  Negative for abdominal pain, constipation, diarrhea, nausea and vomiting.  Endocrine: Negative for cold intolerance, heat intolerance, polydipsia and polyuria.       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   Genitourinary:  Negative for dyspareunia, dysuria, flank pain, frequency and urgency.  Musculoskeletal:  Positive for neck pain. Negative for arthralgias, back pain and myalgias.  Skin:  Negative for rash.  Allergic/Immunologic: Negative for environmental allergies.  Neurological:  Positive for headaches. Negative for dizziness and weakness.  Hematological:  Negative for adenopathy.  Psychiatric/Behavioral:  The patient is not nervous/anxious.    Objective:   Today's Vitals:   Today's Vitals   01/23/21 0844  BP: 107/69  Pulse: 74  Temp: 98.2 F (36.8 C)  SpO2: 98%  Weight: 186 lb 6.4 oz (84.6 kg)  Height: 5\' 5"  (1.651 m)   Body mass index is 31.02 kg/m.   Physical Exam Vitals and nursing note reviewed.  Constitutional:      Appearance: Normal appearance.  She is well-developed.  HENT:     Head: Normocephalic.  Eyes:     Pupils: Pupils are equal, round, and reactive to light.  Neck:     Thyroid: Thyromegaly present.     Comments: Mild right sided thyromegaly.  Cardiovascular:     Rate and Rhythm: Normal rate and regular rhythm.     Pulses: Normal pulses.     Heart sounds: Normal heart sounds.  Pulmonary:     Effort: Pulmonary effort is normal.     Breath sounds: Normal breath sounds.  Abdominal:     Palpations: Abdomen is soft.  Musculoskeletal:         General: Normal range of motion.     Cervical back: Normal range of motion and neck supple.  Lymphadenopathy:     Cervical: No cervical adenopathy.  Skin:    General: Skin is warm and dry.     Capillary Refill: Capillary refill takes less than 2 seconds.  Neurological:     General: No focal deficit present.     Mental Status: She is alert and oriented to person, place, and time.  Psychiatric:        Mood and Affect: Mood normal.        Behavior: Behavior normal.        Thought Content: Thought content normal.        Judgment: Judgment normal.    Assessment & Plan:  1. Encounter to establish care Appointment today to establish new primary care provider    2. Pituitary adenoma ([place]) Check prolactin level along with thyroid panel. Will get MRI to evaluate size/shape of tumor. May need new referral to endocrinology for management.  - T4, free - TSH - MR Brain W Wo Contrast; Future - Prolactin  3. Impaired fasting glucose Check HgbA1c with routine labs today.  - Comprehensive metabolic panel - T4, free - TSH - Hemoglobin A1c  4. Body mass index (BMI) of 31.0-31.9 in adult Discussed lowering calorie intake to 1500 calories per day and incorporating exercise into daily routine to help lose weight.   5. Healthcare maintenance Routine, fasting labs drawn duringtoday's visit.  - CBC with Differential/Platelet - Comprehensive metabolic panel - T4, free - TSH - Lipid panel   Problem List Items Addressed This Visit       Endocrine   Pituitary adenoma ([place])   Relevant Orders   T4, free   TSH   MR Brain W Wo Contrast   Prolactin   Impaired fasting glucose   Relevant Orders   Comprehensive metabolic panel   T4, free   TSH   Hemoglobin A1c     Other   Body mass index (BMI) of 31.0-31.9 in adult   Other Visit Diagnoses     Encounter to establish care    -  Primary   Healthcare maintenance       Relevant Orders   CBC with Differential/Platelet    Comprehensive metabolic panel   T4, free   TSH   Lipid panel       Follow-up: Return in about 4 weeks (around 02/20/2021) for health maintenance exam, with pap.   Ronnell Freshwater, NP

## 2021-01-24 LAB — CBC WITH DIFFERENTIAL/PLATELET
Basophils Absolute: 0 10*3/uL (ref 0.0–0.2)
Basos: 0 %
EOS (ABSOLUTE): 0.1 10*3/uL (ref 0.0–0.4)
Eos: 2 %
Hematocrit: 38.2 % (ref 34.0–46.6)
Hemoglobin: 12.9 g/dL (ref 11.1–15.9)
Immature Grans (Abs): 0 10*3/uL (ref 0.0–0.1)
Immature Granulocytes: 0 %
Lymphocytes Absolute: 1.6 10*3/uL (ref 0.7–3.1)
Lymphs: 26 %
MCH: 26.3 pg — ABNORMAL LOW (ref 26.6–33.0)
MCHC: 33.8 g/dL (ref 31.5–35.7)
MCV: 78 fL — ABNORMAL LOW (ref 79–97)
Monocytes Absolute: 0.3 10*3/uL (ref 0.1–0.9)
Monocytes: 4 %
Neutrophils Absolute: 4.1 10*3/uL (ref 1.4–7.0)
Neutrophils: 68 %
Platelets: 308 10*3/uL (ref 150–450)
RBC: 4.9 x10E6/uL (ref 3.77–5.28)
RDW: 14.5 % (ref 11.7–15.4)
WBC: 6.1 10*3/uL (ref 3.4–10.8)

## 2021-01-24 LAB — LIPID PANEL
Chol/HDL Ratio: 3.4 ratio (ref 0.0–4.4)
Cholesterol, Total: 138 mg/dL (ref 100–199)
HDL: 41 mg/dL (ref >39)
LDL Chol Calc (NIH): 77 mg/dL (ref 0–99)
Triglycerides: 108 mg/dL (ref 0–149)
VLDL Cholesterol Cal: 20 mg/dL (ref 5–40)

## 2021-01-24 LAB — COMPREHENSIVE METABOLIC PANEL
ALT: 10 IU/L (ref 0–32)
AST: 16 IU/L (ref 0–40)
Albumin/Globulin Ratio: 1.7 (ref 1.2–2.2)
Albumin: 4.5 g/dL (ref 3.9–5.0)
Alkaline Phosphatase: 81 IU/L (ref 44–121)
BUN/Creatinine Ratio: 9 (ref 9–23)
BUN: 8 mg/dL (ref 6–20)
Bilirubin Total: 0.3 mg/dL (ref 0.0–1.2)
CO2: 22 mmol/L (ref 20–29)
Calcium: 9.3 mg/dL (ref 8.7–10.2)
Chloride: 105 mmol/L (ref 96–106)
Creatinine, Ser: 0.87 mg/dL (ref 0.57–1.00)
Globulin, Total: 2.7 g/dL (ref 1.5–4.5)
Glucose: 94 mg/dL (ref 70–99)
Potassium: 3.9 mmol/L (ref 3.5–5.2)
Sodium: 138 mmol/L (ref 134–144)
Total Protein: 7.2 g/dL (ref 6.0–8.5)
eGFR: 93 mL/min/{1.73_m2} (ref >59)

## 2021-01-24 LAB — HEMOGLOBIN A1C
Est. average glucose Bld gHb Est-mCnc: 103 mg/dL
Hgb A1c MFr Bld: 5.2 % (ref 4.8–5.6)

## 2021-01-24 LAB — TSH: TSH: 2.78 u[IU]/mL (ref 0.450–4.500)

## 2021-01-24 LAB — T4, FREE: Free T4: 0.94 ng/dL (ref 0.82–1.77)

## 2021-01-24 NOTE — Progress Notes (Signed)
Labs look good thus far. Waiting on all results

## 2021-01-30 ENCOUNTER — Other Ambulatory Visit: Payer: Self-pay

## 2021-01-30 ENCOUNTER — Ambulatory Visit
Admission: RE | Admit: 2021-01-30 | Discharge: 2021-01-30 | Disposition: A | Discharge status: Home / Self Care | Payer: No Typology Code available for payment source | Source: Ambulatory Visit | Attending: Nurse Practitioner | Admitting: Nurse Practitioner

## 2021-01-30 DIAGNOSIS — D352 Benign neoplasm of pituitary gland: Secondary | ICD-10-CM

## 2021-01-30 MED ORDER — GADOBENATE DIMEGLUMINE 529 MG/ML IV SOLN
9.0000 mL | Freq: Once | INTRAVENOUS | Status: AC | PRN
  Administered 2021-01-30: 9 mL via INTRAVENOUS

## 2021-01-30 NOTE — Progress Notes (Signed)
Baseline study. Likely pituitary adenoma or cyst. Will discuss with patient at next visit .

## 2021-02-21 ENCOUNTER — Encounter: Payer: No Typology Code available for payment source | Admitting: Nurse Practitioner

## 2021-02-22 ENCOUNTER — Other Ambulatory Visit (HOSPITAL_COMMUNITY)
Admission: RE | Admit: 2021-02-22 | Discharge: 2021-02-22 | Disposition: A | Discharge status: Home / Self Care | Payer: No Typology Code available for payment source | Source: Ambulatory Visit | Attending: Nurse Practitioner | Admitting: Nurse Practitioner

## 2021-02-22 ENCOUNTER — Other Ambulatory Visit: Payer: Self-pay

## 2021-02-22 ENCOUNTER — Encounter: Payer: Self-pay | Admitting: Nurse Practitioner

## 2021-02-22 ENCOUNTER — Ambulatory Visit (INDEPENDENT_AMBULATORY_CARE_PROVIDER_SITE_OTHER): Payer: No Typology Code available for payment source | Admitting: Nurse Practitioner

## 2021-02-22 VITALS — BP 110/69 | HR 82 | Temp 97.8°F | Ht 65.0 in | Wt 190.0 lb

## 2021-02-22 DIAGNOSIS — D352 Benign neoplasm of pituitary gland: Secondary | ICD-10-CM

## 2021-02-22 DIAGNOSIS — Z6831 Body mass index (BMI) 31.0-31.9, adult: Secondary | ICD-10-CM

## 2021-02-22 DIAGNOSIS — Z30015 Encounter for initial prescription of vaginal ring hormonal contraceptive: Secondary | ICD-10-CM | POA: Diagnosis not present

## 2021-02-22 DIAGNOSIS — M25511 Pain in right shoulder: Secondary | ICD-10-CM | POA: Diagnosis not present

## 2021-02-22 DIAGNOSIS — Z01419 Encounter for gynecological examination (general) (routine) without abnormal findings: Secondary | ICD-10-CM | POA: Diagnosis present

## 2021-02-22 MED ORDER — ETONOGESTREL-ETHINYL ESTRADIOL 0.12-0.015 MG/24HR VA RING: VAGINAL_RING | VAGINAL | 12 refills | Status: DC

## 2021-02-22 MED ORDER — METHYLPREDNISOLONE 4 MG PO TBPK: ORAL_TABLET | ORAL | 0 refills | Status: DC
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
## 2021-02-28 LAB — CYTOLOGY - PAP
Comment: NEGATIVE
Diagnosis: UNDETERMINED — AB
High risk HPV: NEGATIVE

## 2021-03-01 ENCOUNTER — Encounter: Payer: Self-pay | Admitting: Nurse Practitioner

## 2021-03-01 ENCOUNTER — Other Ambulatory Visit: Payer: Self-pay | Admitting: Nurse Practitioner

## 2021-03-01 DIAGNOSIS — N760 Acute vaginitis: Secondary | ICD-10-CM

## 2021-03-01 DIAGNOSIS — B9689 Other specified bacterial agents as the cause of diseases classified elsewhere: Secondary | ICD-10-CM

## 2021-03-01 DIAGNOSIS — A5901 Trichomonal vulvovaginitis: Secondary | ICD-10-CM

## 2021-03-01 MED ORDER — METRONIDAZOLE 500 MG PO TABS: 500.0000 mg | ORAL_TABLET | Freq: Two times a day (BID) | ORAL | 0 refills | Status: DC

## 2021-03-01 MED ORDER — METRONIDAZOLE 0.75 % VA GEL: 1.0000 | Freq: Every day | VAGINAL | 0 refills | Status: DC

## 2021-03-01 NOTE — Progress Notes (Signed)
Changed metronidazole to vaginal gel per patient request

## 2021-03-01 NOTE — Progress Notes (Signed)
ASC-US pap with HPV negative. MyChart message sent to patient.

## 2021-03-01 NOTE — Progress Notes (Signed)
Trichomonas vaginalis present on pap smear. Treat with oral flagyl 500mg  twice daily for 7 days. New rx sent to [facility] on [address] in [place], [place]

## 2021-03-04 DIAGNOSIS — M25511 Pain in right shoulder: Secondary | ICD-10-CM | POA: Insufficient documentation

## 2021-03-04 DIAGNOSIS — Z113 Encounter for screening for infections with a predominantly sexual mode of transmission: Secondary | ICD-10-CM | POA: Insufficient documentation

## 2021-03-04 DIAGNOSIS — Z30015 Encounter for initial prescription of vaginal ring hormonal contraceptive: Secondary | ICD-10-CM | POA: Insufficient documentation

## 2021-03-15 ENCOUNTER — Other Ambulatory Visit: Payer: Self-pay

## 2021-03-15 ENCOUNTER — Encounter: Payer: Self-pay | Admitting: Nurse Practitioner

## 2021-03-15 ENCOUNTER — Ambulatory Visit (INDEPENDENT_AMBULATORY_CARE_PROVIDER_SITE_OTHER): Payer: No Typology Code available for payment source | Admitting: Nurse Practitioner

## 2021-03-15 VITALS — BP 105/70 | HR 79 | Temp 97.5°F | Ht 65.0 in | Wt 190.9 lb

## 2021-03-15 DIAGNOSIS — A64 Unspecified sexually transmitted disease: Secondary | ICD-10-CM

## 2021-03-15 DIAGNOSIS — M25511 Pain in right shoulder: Secondary | ICD-10-CM

## 2021-03-15 DIAGNOSIS — B379 Candidiasis, unspecified: Secondary | ICD-10-CM

## 2021-03-15 DIAGNOSIS — T3695XA Adverse effect of unspecified systemic antibiotic, initial encounter: Secondary | ICD-10-CM | POA: Diagnosis not present

## 2021-03-15 MED ORDER — CYCLOBENZAPRINE HCL 10 MG PO TABS: ORAL_TABLET | ORAL | 1 refills | Status: DC

## 2021-03-15 MED ORDER — FLUCONAZOLE 150 MG PO TABS: ORAL_TABLET | ORAL | 0 refills | Status: DC
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
## 2021-03-16 LAB — RPR: RPR Ser Ql: NONREACTIVE

## 2021-03-16 LAB — HIV ANTIBODY (ROUTINE TESTING W REFLEX): HIV Screen 4th Generation wRfx: NONREACTIVE

## 2021-03-16 LAB — HEPATITIS C ANTIBODY: Hep C Virus Ab: NONREACTIVE

## 2021-03-16 LAB — HSV 2 ANTIBODY, IGG: HSV 2 IgG, Type Spec: <0.91 index (ref 0.00–0.90)

## 2021-03-16 NOTE — Progress Notes (Signed)
Blood work negative for STDs. Waiting on urine screening.

## 2021-03-19 ENCOUNTER — Encounter: Payer: Self-pay | Admitting: Nurse Practitioner

## 2021-03-19 ENCOUNTER — Other Ambulatory Visit: Payer: Self-pay | Admitting: Nurse Practitioner

## 2021-03-19 DIAGNOSIS — A5901 Trichomonal vulvovaginitis: Secondary | ICD-10-CM

## 2021-03-19 LAB — GC/CHLAMYDIA PROBE AMP
Chlamydia trachomatis, NAA: NEGATIVE
Neisseria Gonorrhoeae by PCR: NEGATIVE

## 2021-03-19 LAB — TRICHOMONAS VAGINALIS, PROBE AMP: Trich vag by NAA: POSITIVE — AB

## 2021-03-19 MED ORDER — METRONIDAZOLE 500 MG PO TABS: 500.0000 mg | ORAL_TABLET | Freq: Two times a day (BID) | ORAL | 0 refills | Status: DC

## 2021-03-19 NOTE — Progress Notes (Signed)
Gonorrhea and chlamydia negative. Waiting for results of trichomonas

## 2021-03-24 DIAGNOSIS — B379 Candidiasis, unspecified: Secondary | ICD-10-CM | POA: Insufficient documentation

## 2021-04-02 ENCOUNTER — Other Ambulatory Visit: Payer: Self-pay

## 2021-04-02 ENCOUNTER — Encounter: Payer: Self-pay | Admitting: Nurse Practitioner

## 2021-04-02 ENCOUNTER — Ambulatory Visit (INDEPENDENT_AMBULATORY_CARE_PROVIDER_SITE_OTHER): Payer: No Typology Code available for payment source | Admitting: Nurse Practitioner

## 2021-04-02 VITALS — BP 108/65 | HR 69 | Temp 97.7°F | Ht 65.0 in | Wt 191.1 lb

## 2021-04-02 DIAGNOSIS — R350 Frequency of micturition: Secondary | ICD-10-CM

## 2021-04-02 DIAGNOSIS — A5901 Trichomonal vulvovaginitis: Secondary | ICD-10-CM

## 2021-04-02 LAB — POCT URINALYSIS DIP (CLINITEK)
Bilirubin, UA: NEGATIVE
Glucose, UA: NEGATIVE mg/dL
Ketones, POC UA: NEGATIVE mg/dL
Leukocytes, UA: NEGATIVE
Nitrite, UA: NEGATIVE
POC PROTEIN,UA: NEGATIVE
Spec Grav, UA: 1.02 (ref 1.010–1.025)
Urobilinogen, UA: 0.2 E.U./dL
pH, UA: 6 (ref 5.0–8.0)

## 2021-04-02 NOTE — Progress Notes (Signed)
Established patient visit ? ? ?Patient: Amanda Banks   DOB: 1990/07/30   31 y.o. Female  MRN: [id number] ?Visit Date: 04/02/2021 ? ? ?Chief Complaint  ?Patient presents with  ? Follow-up  ? ?Subjective  ?  ?HPI  ?The patient is here for follow up of treatment for sexually transmitted infection. Has tested positive on several occassions for trichomoniasis. Has been treated appropriately for this. Her partner has tested negative each time she has tested positive. He has also been treated with appropriate medications. She has abstained from intercourse since starting this most recent round of antibiotics. She states that she continues to have urinary frequency without burning or dysuria.  ? ? ?Medications: ?Outpatient Medications Prior to Visit  ?Medication Sig  ? cyclobenzaprine (FLEXERIL) 10 MG tablet Take 1/2 to 1 tablet po qhs prn muscle pain  ? etonogestrel-ethinyl estradiol (NUVARING) 0.12-0.015 MG/24HR vaginal ring Insert vaginally and leave in place for 3 consecutive weeks, then remove for 1 week.  ? [DISCONTINUED] fluconazole (DIFLUCAN) 150 MG tablet Take 1 tablet po once. May repeat dose in 3 days as needed for persistent symptoms.  ? [DISCONTINUED] meloxicam (MOBIC) 15 MG tablet Take 1 tablet (15 mg total) by mouth daily.  ? [DISCONTINUED] methylPREDNISolone (MEDROL) 4 MG TBPK tablet Take by mouth as directed for 6 days  ? [DISCONTINUED] metroNIDAZOLE (FLAGYL) 500 MG tablet Take 1 tablet (500 mg total) by mouth 2 (two) times daily.  ? ?No facility-administered medications prior to visit.  ? ? ?Review of Systems  ?Constitutional:  Negative for activity change, appetite change, chills, fatigue and fever.  ?HENT:  Negative for congestion, postnasal drip, rhinorrhea, sinus pressure, sinus pain, sneezing and sore throat.   ?Eyes: Negative.   ?Respiratory:  Negative for cough, chest tightness, shortness of breath and wheezing.   ?Cardiovascular:  Negative for chest pain and palpitations.  ?Gastrointestinal:   Negative for abdominal pain, constipation, diarrhea, nausea and vomiting.  ?Endocrine: Negative for cold intolerance, heat intolerance, polydipsia and polyuria.  ?Genitourinary:  Positive for frequency. Negative for decreased urine volume, dyspareunia, dysuria, flank pain, menstrual problem, urgency, vaginal bleeding, vaginal discharge and vaginal pain.  ?     The patient has been treated three separate times for trichomonas. Denies any vaginal complaints .  ?Musculoskeletal:  Negative for arthralgias, back pain and myalgias.  ?Skin:  Negative for rash.  ?Allergic/Immunologic: Negative for environmental allergies.  ?Neurological:  Negative for dizziness, weakness and headaches.  ?Hematological:  Negative for adenopathy.  ?Psychiatric/Behavioral:  The patient is not nervous/anxious.   ? ? ? ? Objective  ?  ? ?Today's Vitals  ? 04/02/21 1014  ?BP: 108/65  ?Pulse: 69  ?Temp: 97.7 ?F (36.5 ?C)  ?SpO2: 99%  ?Weight: 191 lb 1.6 oz (86.7 kg)  ?Height: '5\' 5"'$  (1.651 m)  ? ?Body mass index is 31.8 kg/m?.  ? ? ?BP Readings from Last 3 Encounters:  ?04/02/21 108/65  ?03/15/21 105/70  ?02/22/21 110/69  ?  ?Wt Readings from Last 3 Encounters:  ?04/02/21 191 lb 1.6 oz (86.7 kg)  ?03/15/21 190 lb 14.4 oz (86.6 kg)  ?02/22/21 190 lb (86.2 kg)  ?  ?Physical Exam ?Vitals and nursing note reviewed.  ?Constitutional:   ?   Appearance: Normal appearance. She is well-developed.  ?HENT:  ?   Head: Normocephalic.  ?   Nose: Nose normal.  ?   Mouth/Throat:  ?   Mouth: Mucous membranes are moist.  ?   Pharynx: Oropharynx is clear.  ?Eyes:  ?  Extraocular Movements: Extraocular movements intact.  ?   Conjunctiva/sclera: Conjunctivae normal.  ?   Pupils: Pupils are equal, round, and reactive to light.  ?Cardiovascular:  ?   Rate and Rhythm: Normal rate and regular rhythm.  ?   Pulses: Normal pulses.  ?   Heart sounds: Normal heart sounds.  ?Pulmonary:  ?   Effort: Pulmonary effort is normal.  ?   Breath sounds: Normal breath sounds.   ?Abdominal:  ?   Palpations: Abdomen is soft.  ?Genitourinary: ?   Comments: Urine dip showing trace blood only. ?Musculoskeletal:     ?   General: Normal range of motion.  ?   Cervical back: Normal range of motion and neck supple.  ?Lymphadenopathy:  ?   Cervical: No cervical adenopathy.  ?Skin: ?   General: Skin is warm and dry.  ?   Capillary Refill: Capillary refill takes less than 2 seconds.  ?Neurological:  ?   General: No focal deficit present.  ?   Mental Status: She is alert and oriented to person, place, and time.  ?Psychiatric:     ?   Mood and Affect: Mood normal.     ?   Behavior: Behavior normal.     ?   Thought Content: Thought content normal.     ?   Judgment: Judgment normal.  ?  ? ?Results for orders placed or performed in visit on 04/02/21  ?POCT URINALYSIS DIP (CLINITEK)  ?Result Value Ref Range  ? Color, UA yellow yellow  ? Clarity, UA clear clear  ? Glucose, UA negative negative mg/dL  ? Bilirubin, UA negative negative  ? Ketones, POC UA negative negative mg/dL  ? Spec Grav, UA 1.020 1.010 - 1.025  ? Blood, UA trace-intact (A) negative  ? pH, UA 6.0 5.0 - 8.0  ? POC PROTEIN,UA negative negative, trace  ? Urobilinogen, UA 0.2 0.2 or 1.0 E.U./dL  ? Nitrite, UA Negative Negative  ? Leukocytes, UA Negative Negative  ? ? Assessment & Plan  ?  ?1. Trichomonal vaginitis ?Patient recently completed seven day course of flagyl '500mg'$  twice daily for 7 days. Recheck urine for clearance.  Treat as indicated. Refer to GYN provider if positive.  ?- GC/Chlamydia Probe Amp; Future ?- Trichomonas vaginalis, RNA; Future ?- GC/Chlamydia Probe Amp ?- Trichomonas vaginalis, RNA ? ?2. Urinary frequency ?Urine sample positive for trace blood only.  ?- POCT URINALYSIS DIP (CLINITEK)  ? ?Problem List Items Addressed This Visit   ? ?  ? Genitourinary  ? Trichomonal vaginitis - Primary  ? Relevant Orders  ? GC/Chlamydia Probe Amp  ? Trichomonas vaginalis, RNA  ?  ? Other  ? Urinary frequency  ? Relevant Orders  ? POCT  URINALYSIS DIP (CLINITEK) (Completed)  ?  ? ?Return in about 1 year (around 04/03/2022) for health maintenance exam.  ?   ? ? ? ? ?Ronnell Freshwater, NP  ?[place] [facility] ?[phone] (phone) [phone] (fax) ? ?[facility]  ?

## 2021-04-04 ENCOUNTER — Encounter: Payer: Self-pay | Admitting: Nurse Practitioner

## 2021-04-04 LAB — GC/CHLAMYDIA PROBE AMP
Chlamydia trachomatis, NAA: NEGATIVE
Neisseria Gonorrhoeae by PCR: NEGATIVE

## 2021-04-04 LAB — TRICHOMONAS VAGINALIS, PROBE AMP: Trich vag by NAA: NEGATIVE

## 2021-04-04 NOTE — Progress Notes (Signed)
Urinalysis negative for evidence of infection or abnormalities

## 2021-04-04 NOTE — Progress Notes (Signed)
All results are negative. MyChart message sent to patient.

## 2021-04-04 NOTE — Progress Notes (Signed)
Negative gonorrhea and chlamydia. Waiting on test results for trichomonas.

## 2021-07-26 ENCOUNTER — Encounter: Payer: Self-pay | Admitting: Nurse Practitioner

## 2021-07-26 ENCOUNTER — Ambulatory Visit (INDEPENDENT_AMBULATORY_CARE_PROVIDER_SITE_OTHER): Payer: No Typology Code available for payment source | Admitting: Nurse Practitioner

## 2021-07-26 VITALS — BP 103/72 | HR 80 | Temp 98.0°F | Ht 64.96 in | Wt 199.1 lb

## 2021-07-26 DIAGNOSIS — Z6833 Body mass index (BMI) 33.0-33.9, adult: Secondary | ICD-10-CM

## 2021-07-26 DIAGNOSIS — J0140 Acute pansinusitis, unspecified: Secondary | ICD-10-CM | POA: Diagnosis not present

## 2021-07-26 DIAGNOSIS — J300 Vasomotor rhinitis: Secondary | ICD-10-CM

## 2021-07-26 DIAGNOSIS — N764 Abscess of vulva: Secondary | ICD-10-CM | POA: Diagnosis not present

## 2021-07-26 DIAGNOSIS — Z6834 Body mass index (BMI) 34.0-34.9, adult: Secondary | ICD-10-CM

## 2021-07-26 MED ORDER — PHENTERMINE HCL 15 MG PO CAPS: 15.0000 mg | ORAL_CAPSULE | ORAL | 0 refills | Status: DC

## 2021-07-26 MED ORDER — FLUTICASONE PROPIONATE 50 MCG/ACT NA SUSP: 2.0000 | Freq: Every day | NASAL | 6 refills | Status: DC

## 2021-07-26 MED ORDER — SULFAMETHOXAZOLE-TRIMETHOPRIM 800-160 MG PO TABS: 1.0000 | ORAL_TABLET | Freq: Two times a day (BID) | ORAL | 0 refills | Status: DC

## 2021-07-26 NOTE — Progress Notes (Signed)
Established patient visit   Patient: Amanda Banks   DOB: [birth year]   31 y.o. Female  MRN: [id number] Visit Date: 07/26/2021   Chief Complaint  Patient presents with   dicuss weight loss   Subjective    HPI  Patient would like to discuss options to help with weight management. Labs done 12/2020 indicated no disorder with thyroid. No evidence of diabetes.  The patient states that this is the heaviest she has ever been. She feels like it is causing her to feel very fatigued. States that with weight gain she has noted shortness of breath with exertion.  -she states that she has changed the way she eats. She has cut out sodas completely.  She has treadmill and squat machine at home which she uses 4 to 5 days per week.  States that she is just gaining weight rather than losing it. Feels heavy and sluggish.  -has cough and nasal congestion. Has been using over the counter medications which have not helped.  -has cyst on her skin in groin. States that appeared after waxing. Is tender. Red.    Medications: Outpatient Medications Prior to Visit  Medication Sig   cyclobenzaprine (FLEXERIL) 10 MG tablet Take 1/2 to 1 tablet po qhs prn muscle pain   etonogestrel-ethinyl estradiol (NUVARING) 0.12-0.015 MG/24HR vaginal ring Insert vaginally and leave in place for 3 consecutive weeks, then remove for 1 week.   No facility-administered medications prior to visit.    Review of Systems  Constitutional:  Positive for fatigue and unexpected weight change. Negative for activity change, appetite change, chills and fever.       Eight pound weight gain since last visit   HENT:  Positive for congestion, postnasal drip, sinus pressure, sinus pain and sneezing. Negative for rhinorrhea and sore throat.   Eyes: Negative.   Respiratory:  Positive for cough. Negative for chest tightness, shortness of breath and wheezing.   Cardiovascular:  Negative for chest pain and palpitations.  Gastrointestinal:   Negative for abdominal pain, constipation, diarrhea, nausea and vomiting.  Endocrine: Negative for cold intolerance, heat intolerance, polydipsia and polyuria.  Genitourinary:  Negative for dyspareunia, dysuria, flank pain, frequency and urgency.  Musculoskeletal:  Negative for arthralgias, back pain and myalgias.  Skin:  Negative for rash.  Allergic/Immunologic: Positive for environmental allergies.  Neurological:  Positive for headaches. Negative for dizziness and weakness.  Hematological:  Negative for adenopathy.  Psychiatric/Behavioral:  The patient is not nervous/anxious.     Last CBC Lab Results  Component Value Date   WBC 6.1 01/23/2021   HGB 12.9 01/23/2021   HCT 38.2 01/23/2021   MCV 78 (L) 01/23/2021   MCH 26.3 (L) 01/23/2021   RDW 14.5 01/23/2021   PLT 308 17/61/6073   Last metabolic panel Lab Results  Component Value Date   GLUCOSE 94 01/23/2021   NA 138 01/23/2021   K 3.9 01/23/2021   CL 105 01/23/2021   CO2 22 01/23/2021   BUN 8 01/23/2021   CREATININE 0.87 01/23/2021   EGFR 93 01/23/2021   CALCIUM 9.3 01/23/2021   PROT 7.2 01/23/2021   ALBUMIN 4.5 01/23/2021   LABGLOB 2.7 01/23/2021   AGRATIO 1.7 01/23/2021   BILITOT 0.3 01/23/2021   ALKPHOS 81 01/23/2021   AST 16 01/23/2021   ALT 10 01/23/2021   Last lipids Lab Results  Component Value Date   CHOL 138 01/23/2021   HDL 41 01/23/2021   LDLCALC 77 01/23/2021   TRIG 108 01/23/2021   CHOLHDL 3.4  01/23/2021   Last hemoglobin A1c Lab Results  Component Value Date   HGBA1C 5.2 01/23/2021   Last thyroid functions Lab Results  Component Value Date   TSH 2.780 01/23/2021        Objective     Today's Vitals   07/26/21 1529  BP: 103/72  Pulse: 80  Temp: 98 F (36.7 C)  SpO2: 100%  Weight: 199 lb 1.9 oz (90.3 kg)  Height: 5' 4.96" (1.65 m)   Body mass index is 33.18 kg/m.   BP Readings from Last 3 Encounters:  07/26/21 103/72  04/02/21 108/65  03/15/21 105/70    Wt Readings from  Last 3 Encounters:  07/26/21 199 lb 1.9 oz (90.3 kg)  04/02/21 191 lb 1.6 oz (86.7 kg)  03/15/21 190 lb 14.4 oz (86.6 kg)    Physical Exam Vitals and nursing note reviewed.  Constitutional:      Appearance: Normal appearance. She is well-developed. She is obese.  HENT:     Head: Normocephalic and atraumatic.     Right Ear: Tympanic membrane, ear canal and external ear normal.     Left Ear: Tympanic membrane, ear canal and external ear normal.     Nose: Congestion present.     Right Sinus: Maxillary sinus tenderness and frontal sinus tenderness present.     Left Sinus: Maxillary sinus tenderness and frontal sinus tenderness present.     Mouth/Throat:     Mouth: Mucous membranes are moist.     Pharynx: Oropharynx is clear. Posterior oropharyngeal erythema present.  Eyes:     Extraocular Movements: Extraocular movements intact.     Conjunctiva/sclera: Conjunctivae normal.     Pupils: Pupils are equal, round, and reactive to light.  Cardiovascular:     Rate and Rhythm: Normal rate and regular rhythm.     Pulses: Normal pulses.     Heart sounds: Normal heart sounds.  Pulmonary:     Effort: Pulmonary effort is normal.     Breath sounds: Normal breath sounds.  Abdominal:     Palpations: Abdomen is soft.  Genitourinary:    Comments: Reddened and swollen area of lefft groin area adjacent to the labia majora. Skin is intact. Area tender. No drainage present at this time.  Musculoskeletal:        General: Normal range of motion.     Cervical back: Normal range of motion and neck supple.  Lymphadenopathy:     Cervical: No cervical adenopathy.  Skin:    General: Skin is warm and dry.     Capillary Refill: Capillary refill takes less than 2 seconds.  Neurological:     General: No focal deficit present.     Mental Status: She is alert and oriented to person, place, and time.  Psychiatric:        Mood and Affect: Mood normal.        Behavior: Behavior normal.        Thought Content:  Thought content normal.        Judgment: Judgment normal.       Assessment & Plan    1. Acute non-recurrent pansinusitis Start bactrim DS twice daily for 10 days. Rest and increase fluids. Continue using OTC medication to control symptoms.   - sulfamethoxazole-trimethoprim (BACTRIM DS) 800-160 MG tablet; Take 1 tablet by mouth 2 (two) times daily.  Dispense: 20 tablet; Refill: 0  2. Vasomotor rhinitis Add flonase nasal spray. Use 2 sprays in both nostrils daily. Continue to use oral antihistamines as  previously prescribed  - fluticasone (FLONASE) 50 MCG/ACT nasal spray; Place 2 sprays into both nostrils daily.  Dispense: 16 g; Refill: 6  3. BMI 33.0-33.9,adult Trial phentermine 15 mg daily. Advised her to continue with low calorie diet and regular exercise program. Reassess in one month.  - phentermine 15 MG capsule; Take 1 capsule (15 mg total) by mouth every morning.  Dispense: 30 capsule; Refill: 0  4. Furuncle of labia majora Ingrown hair/small abscess present on left labia. Bactrim DS twice daily for 10 days to resolve bacterial causes. Advised her to keep area clean and dry. Apply warm and moist compress to area in 20 minute intervals to facilitate draining.   Problem List Items Addressed This Visit       Respiratory   Acute non-recurrent pansinusitis - Primary   Relevant Medications   sulfamethoxazole-trimethoprim (BACTRIM DS) 800-160 MG tablet   fluticasone (FLONASE) 50 MCG/ACT nasal spray   Vasomotor rhinitis   Relevant Medications   fluticasone (FLONASE) 50 MCG/ACT nasal spray     Musculoskeletal and Integument   Furuncle of labia majora   Relevant Medications   sulfamethoxazole-trimethoprim (BACTRIM DS) 800-160 MG tablet     Other   BMI 33.0-33.9,adult   Relevant Medications   phentermine 15 MG capsule     Return in about 4 weeks (around 08/23/2021) for routine - weight management.         Ronnell Freshwater, NP  [facility] at [facility] [phone] (phone) [phone] (fax)  [place]

## 2021-07-27 ENCOUNTER — Other Ambulatory Visit: Payer: Self-pay

## 2021-07-27 MED ORDER — FLUTICASONE PROPIONATE 50 MCG/ACT NA SUSP
NASAL | 6 refills | Status: DC
  Filled 2021-07-27: qty 16, 30d supply, fill #0
  Filled 2021-10-15: qty 16, 30d supply, fill #1
  Filled 2021-10-18: qty 16, 30d supply, fill #0
  Filled 2021-11-27: qty 16, 30d supply, fill #1
  Filled 2021-12-23: qty 16, 30d supply, fill #2
  Filled 2022-01-14 – 2022-01-22 (×2): qty 16, 30d supply, fill #3
  Filled 2022-05-17: qty 16, 30d supply, fill #4

## 2021-08-07 ENCOUNTER — Encounter: Payer: Self-pay | Admitting: Nurse Practitioner

## 2021-08-07 ENCOUNTER — Other Ambulatory Visit: Payer: Self-pay | Admitting: Nurse Practitioner

## 2021-08-07 DIAGNOSIS — B9689 Other specified bacterial agents as the cause of diseases classified elsewhere: Secondary | ICD-10-CM

## 2021-08-07 MED ORDER — METRONIDAZOLE 0.75 % VA GEL: 1.0000 | Freq: Every day | VAGINAL | 0 refills | Status: DC

## 2021-08-07 NOTE — Progress Notes (Signed)
Sent new prescription for metronidazole vaginal gel to [facility] on [address] in [place].

## 2021-08-11 DIAGNOSIS — J300 Vasomotor rhinitis: Secondary | ICD-10-CM | POA: Insufficient documentation

## 2021-08-11 DIAGNOSIS — J0140 Acute pansinusitis, unspecified: Secondary | ICD-10-CM | POA: Insufficient documentation

## 2021-08-11 DIAGNOSIS — N764 Abscess of vulva: Secondary | ICD-10-CM | POA: Insufficient documentation

## 2021-08-26 NOTE — Progress Notes (Deleted)
Established patient visit   Patient: Amanda Banks   DOB: 1990/07/07   31 y.o. Female  MRN: [id number] Visit Date: 08/27/2021   No chief complaint on file.  Subjective    HPI  Follow up weight management.  -started on phentermine 15 mg daily   -initial weight 07/26/2021 - 199 -today's weight - 08/27/2021 -  -weight loss since last visit -  -negative side effects from phentermine -    Medications: Outpatient Medications Prior to Visit  Medication Sig   cyclobenzaprine (FLEXERIL) 10 MG tablet Take 1/2 to 1 tablet po qhs prn muscle pain   etonogestrel-ethinyl estradiol (NUVARING) 0.12-0.015 MG/24HR vaginal ring Insert vaginally and leave in place for 3 consecutive weeks, then remove for 1 week.   fluticasone (FLONASE) 50 MCG/ACT nasal spray Place 2 sprays into both nostrils daily.   fluticasone (FLONASE) 50 MCG/ACT nasal spray Place 2 sprays into both nostrils once daily (shake well before use)   metroNIDAZOLE (METROGEL) 0.75 % vaginal gel Place 1 Applicatorful vaginally at bedtime.   phentermine 15 MG capsule Take 1 capsule (15 mg total) by mouth every morning.   sulfamethoxazole-trimethoprim (BACTRIM DS) 800-160 MG tablet Take 1 tablet by mouth 2 (two) times daily.   No facility-administered medications prior to visit.    Review of Systems  {Labs (Optional):23779}   Objective    There were no vitals taken for this visit. BP Readings from Last 3 Encounters:  07/26/21 103/72  04/02/21 108/65  03/15/21 105/70    Wt Readings from Last 3 Encounters:  07/26/21 199 lb 1.9 oz (90.3 kg)  04/02/21 191 lb 1.6 oz (86.7 kg)  03/15/21 190 lb 14.4 oz (86.6 kg)    Physical Exam  ***  No results found for any visits on 08/27/21.  Assessment & Plan     Problem List Items Addressed This Visit   None    No follow-ups on file.         Ronnell Freshwater, NP  [facility] at [facility] [phone] (phone) [phone] (fax)  [place]

## 2021-08-27 ENCOUNTER — Encounter: Payer: Self-pay | Admitting: Nurse Practitioner

## 2021-08-27 ENCOUNTER — Ambulatory Visit: Payer: No Typology Code available for payment source | Admitting: Nurse Practitioner

## 2021-08-28 ENCOUNTER — Other Ambulatory Visit: Payer: Self-pay | Admitting: Nurse Practitioner

## 2021-08-28 DIAGNOSIS — Z6833 Body mass index (BMI) 33.0-33.9, adult: Secondary | ICD-10-CM

## 2021-08-28 MED ORDER — PHENTERMINE HCL 30 MG PO CAPS: 30.0000 mg | ORAL_CAPSULE | ORAL | 0 refills | Status: DC

## 2021-09-05 ENCOUNTER — Ambulatory Visit: Payer: No Typology Code available for payment source | Admitting: Nurse Practitioner

## 2021-09-11 NOTE — Progress Notes (Deleted)
Established patient visit   Patient: Amanda Banks   DOB: 11-17-1990   31 y.o. Female  MRN: [id number] Visit Date: 09/12/2021   No chief complaint on file.  Subjective    HPI  Follow up weight management  -started with phentermine 15 mg and increased to 30 mg daily  -weight at start of treatment 07/26/2021 - 199 -today's weight 09/12/2021 -  -weight loss since last visit -  -side effects from taking phentermine -    Medications: Outpatient Medications Prior to Visit  Medication Sig   cyclobenzaprine (FLEXERIL) 10 MG tablet Take 1/2 to 1 tablet po qhs prn muscle pain   etonogestrel-ethinyl estradiol (NUVARING) 0.12-0.015 MG/24HR vaginal ring Insert vaginally and leave in place for 3 consecutive weeks, then remove for 1 week.   fluticasone (FLONASE) 50 MCG/ACT nasal spray Place 2 sprays into both nostrils daily.   fluticasone (FLONASE) 50 MCG/ACT nasal spray Place 2 sprays into both nostrils once daily (shake well before use)   metroNIDAZOLE (METROGEL) 0.75 % vaginal gel Place 1 Applicatorful vaginally at bedtime.   phentermine 30 MG capsule Take 1 capsule (30 mg total) by mouth every morning.   sulfamethoxazole-trimethoprim (BACTRIM DS) 800-160 MG tablet Take 1 tablet by mouth 2 (two) times daily.   No facility-administered medications prior to visit.    Review of Systems  {Labs (Optional):23779}   Objective    There were no vitals taken for this visit. BP Readings from Last 3 Encounters:  07/26/21 103/72  04/02/21 108/65  03/15/21 105/70    Wt Readings from Last 3 Encounters:  07/26/21 199 lb 1.9 oz (90.3 kg)  04/02/21 191 lb 1.6 oz (86.7 kg)  03/15/21 190 lb 14.4 oz (86.6 kg)    Physical Exam  ***  No results found for any visits on 09/12/21.  Assessment & Plan     Problem List Items Addressed This Visit   None    No follow-ups on file.         Ronnell Freshwater, NP  [facility] Primary [facility] at [facility] [phone] (phone) [phone]  (fax)  [place]

## 2021-09-12 ENCOUNTER — Ambulatory Visit: Payer: No Typology Code available for payment source | Admitting: Nurse Practitioner

## 2021-10-15 ENCOUNTER — Other Ambulatory Visit (HOSPITAL_COMMUNITY): Payer: Self-pay

## 2021-10-16 ENCOUNTER — Other Ambulatory Visit: Payer: Self-pay

## 2021-10-16 ENCOUNTER — Other Ambulatory Visit (HOSPITAL_COMMUNITY): Payer: Self-pay

## 2021-10-16 ENCOUNTER — Encounter: Payer: Self-pay | Admitting: Nurse Practitioner

## 2021-10-16 ENCOUNTER — Ambulatory Visit (INDEPENDENT_AMBULATORY_CARE_PROVIDER_SITE_OTHER): Payer: No Typology Code available for payment source | Admitting: Nurse Practitioner

## 2021-10-16 VITALS — BP 96/68 | HR 68 | Ht 64.96 in | Wt 188.0 lb

## 2021-10-16 DIAGNOSIS — R7303 Prediabetes: Secondary | ICD-10-CM

## 2021-10-16 DIAGNOSIS — Z6831 Body mass index (BMI) 31.0-31.9, adult: Secondary | ICD-10-CM | POA: Diagnosis not present

## 2021-10-16 DIAGNOSIS — Z020 Encounter for examination for admission to educational institution: Secondary | ICD-10-CM

## 2021-10-16 DIAGNOSIS — Z111 Encounter for screening for respiratory tuberculosis: Secondary | ICD-10-CM

## 2021-10-16 DIAGNOSIS — Z23 Encounter for immunization: Secondary | ICD-10-CM | POA: Diagnosis not present

## 2021-10-16 MED ORDER — OZEMPIC (0.25 OR 0.5 MG/DOSE) 2 MG/3ML ~~LOC~~ SOPN
0.2500 mg | PEN_INJECTOR | SUBCUTANEOUS | 2 refills | Status: DC
  Filled 2021-10-16: qty 3, 30d supply, fill #0
  Filled 2021-10-22: qty 3, 28d supply, fill #0

## 2021-10-16 MED ORDER — OZEMPIC (0.25 OR 0.5 MG/DOSE) 2 MG/3ML ~~LOC~~ SOPN: 0.2500 mg | PEN_INJECTOR | SUBCUTANEOUS | 2 refills | Status: DC

## 2021-10-16 NOTE — Progress Notes (Signed)
Established patient visit   Patient: Amanda Banks   DOB: 09-Dec-1990   31 y.o. Female  MRN: [id number] Visit Date: 10/16/2021   Chief Complaint  Patient presents with   Medication Refill   Subjective    HPI  Weight management follow up -started phenterikne 15 mg 07/26/2021. This was increased to 30 mg daily on 08/28/2021.  -starting weight 07/26/2021 - 199  -today's weight - 10/16/2021  -weight loss since starting medications - 11 pounds -negative side effects associated with taking phentermine - nausea and intermittent palpitations. Had EKG during one episode of palpitations. Had irregular rhythm noted, though overall, the EKG was normal.   Would like to get her tetanus booster today  -needs to get quantiferon gold for school    Medications: Outpatient Medications Prior to Visit  Medication Sig   etonogestrel-ethinyl estradiol (NUVARING) 0.12-0.015 MG/24HR vaginal ring Insert vaginally and leave in place for 3 consecutive weeks, then remove for 1 week.   fluticasone (FLONASE) 50 MCG/ACT nasal spray Place 2 sprays into both nostrils once daily (shake well before use)   [DISCONTINUED] phentermine 30 MG capsule Take 1 capsule (30 mg total) by mouth every morning.   [DISCONTINUED] sulfamethoxazole-trimethoprim (BACTRIM DS) 800-160 MG tablet Take 1 tablet by mouth 2 (two) times daily.   [DISCONTINUED] cyclobenzaprine (FLEXERIL) 10 MG tablet Take 1/2 to 1 tablet po qhs prn muscle pain   [DISCONTINUED] fluticasone (FLONASE) 50 MCG/ACT nasal spray Place 2 sprays into both nostrils daily.   [DISCONTINUED] metroNIDAZOLE (METROGEL) 0.75 % vaginal gel Place 1 Applicatorful vaginally at bedtime.   No facility-administered medications prior to visit.    Review of Systems  Constitutional:  Negative for activity change, appetite change, chills, fatigue and fever.       11 pound weight loss since last visit   HENT:  Negative for congestion, postnasal drip, rhinorrhea, sinus pressure, sinus pain,  sneezing and sore throat.   Eyes: Negative.   Respiratory:  Negative for cough, chest tightness, shortness of breath and wheezing.   Cardiovascular:  Positive for chest pain and palpitations.       Only happening when she takes phentermine   Gastrointestinal:  Positive for nausea. Negative for abdominal pain, constipation and vomiting.  Endocrine: Negative for cold intolerance, heat intolerance, polydipsia and polyuria.  Genitourinary:  Negative for dyspareunia, dysuria, flank pain, frequency and urgency.  Musculoskeletal:  Negative for arthralgias, back pain and myalgias.  Skin:  Negative for rash.  Allergic/Immunologic: Negative for environmental allergies.  Neurological:  Negative for dizziness, weakness and headaches.  Hematological:  Negative for adenopathy.  Psychiatric/Behavioral:  The patient is not nervous/anxious.     Last CBC Lab Results  Component Value Date   WBC 5.1 11/01/2021   HGB 12.3 11/01/2021   HCT 36.8 11/01/2021   MCV 79.7 (L) 11/01/2021   MCH 26.6 11/01/2021   RDW 13.2 11/01/2021   PLT 262 09/32/6712   Last metabolic panel Lab Results  Component Value Date   GLUCOSE 91 11/01/2021   NA 139 11/01/2021   K 3.8 11/01/2021   CL 106 11/01/2021   CO2 27 11/01/2021   BUN 9 11/01/2021   CREATININE 0.68 11/01/2021   EGFR 93 01/23/2021   CALCIUM 9.1 11/01/2021   PROT 7.4 11/01/2021   ALBUMIN 4.0 11/01/2021   LABGLOB 2.7 01/23/2021   AGRATIO 1.7 01/23/2021   BILITOT 0.6 11/01/2021   ALKPHOS 77 11/01/2021   AST 18 11/01/2021   ALT 14 11/01/2021   ANIONGAP 6 11/01/2021  Last lipids Lab Results  Component Value Date   CHOL 138 01/23/2021   HDL 41 01/23/2021   LDLCALC 77 01/23/2021   TRIG 108 01/23/2021   CHOLHDL 3.4 01/23/2021   Last hemoglobin A1c Lab Results  Component Value Date   HGBA1C 5.2 01/23/2021   Last thyroid functions Lab Results  Component Value Date   TSH 2.780 01/23/2021        Objective     Today's Vitals   10/16/21  1442  BP: 96/68  Pulse: 68  SpO2: 100%  Weight: 188 lb (85.3 kg)  Height: 5' 4.96" (1.65 m)   Body mass index is 31.32 kg/m.   BP Readings from Last 3 Encounters:  11/01/21 120/86  10/16/21 96/68  07/26/21 103/72    Wt Readings from Last 3 Encounters:  11/01/21 187 lb (84.8 kg)  10/16/21 188 lb (85.3 kg)  07/26/21 199 lb 1.9 oz (90.3 kg)    Physical Exam Vitals and nursing note reviewed.  Constitutional:      Appearance: Normal appearance. She is well-developed. She is obese.  HENT:     Head: Normocephalic and atraumatic.  Eyes:     Pupils: Pupils are equal, round, and reactive to light.  Cardiovascular:     Rate and Rhythm: Normal rate and regular rhythm.     Pulses: Normal pulses.     Heart sounds: Normal heart sounds.  Pulmonary:     Effort: Pulmonary effort is normal.     Breath sounds: Normal breath sounds.  Abdominal:     Palpations: Abdomen is soft.  Musculoskeletal:        General: Normal range of motion.     Cervical back: Normal range of motion and neck supple.  Lymphadenopathy:     Cervical: No cervical adenopathy.  Skin:    General: Skin is warm and dry.     Capillary Refill: Capillary refill takes less than 2 seconds.  Neurological:     General: No focal deficit present.     Mental Status: She is alert and oriented to person, place, and time.  Psychiatric:        Mood and Affect: Mood normal.        Behavior: Behavior normal.        Thought Content: Thought content normal.        Judgment: Judgment normal.       Assessment & Plan    1. Prediabetes Trial ozempic 0.25 mg weekly for three weeks then increase to 0.5 mg weekly. Control intake of carbohydrates and sugar and increase routine exercise   2. BMI 31.0-31.9,adult Discussed lowering calorie intake to 1500 calories per day and incorporating exercise into daily routine to help lose weight. Add ozempic to help reduce appetite.   3. Need for Tdap vaccination TDAP vaccine administered  during today's visit  - Tdap vaccine greater than or equal to 7yo IM  4. Encounter for school examination School physical form to be completed and returned to patient as soon as complete.  5. Screening examination for pulmonary tuberculosis Quantiferon gold test ordered and drawn during today's visit.    Problem List Items Addressed This Visit       Other   BMI 31.0-31.9,adult   Prediabetes - Primary   Other Visit Diagnoses     Need for Tdap vaccination       Relevant Orders   Tdap vaccine greater than or equal to 7yo IM (Completed)   Encounter for school examination  Screening examination for pulmonary tuberculosis            Return in about 2 months (around 12/16/2021) for routine - weight management - started ozempic - needs lab appointment for thursday to get quant gold.         Ronnell Freshwater, NP  [facility] Health Primary Care at [facility] [phone] (phone) [phone] (fax)  [place]

## 2021-10-17 ENCOUNTER — Other Ambulatory Visit: Payer: No Typology Code available for payment source

## 2021-10-17 DIAGNOSIS — Z111 Encounter for screening for respiratory tuberculosis: Secondary | ICD-10-CM

## 2021-10-18 ENCOUNTER — Other Ambulatory Visit (HOSPITAL_COMMUNITY): Payer: Self-pay

## 2021-10-19 NOTE — Progress Notes (Signed)
Waiting for all results

## 2021-10-22 ENCOUNTER — Telehealth: Payer: Self-pay

## 2021-10-22 ENCOUNTER — Other Ambulatory Visit (HOSPITAL_COMMUNITY): Payer: Self-pay

## 2021-10-22 LAB — QUANTIFERON-TB GOLD PLUS
QuantiFERON Mitogen Value: >10 IU/mL
QuantiFERON Nil Value: 0.08 IU/mL
QuantiFERON TB1 Ag Value: 0.66 IU/mL
QuantiFERON TB2 Ag Value: 0.51 IU/mL
QuantiFERON-TB Gold Plus: POSITIVE — AB

## 2021-10-22 NOTE — Telephone Encounter (Signed)
Patient called office to get results from her blood work, please advise, thanks!

## 2021-10-22 NOTE — Progress Notes (Signed)
Hey Dr. Lanetta Inch.  I think this is the first time I have had a positive quant gold. 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 What is the next step?

## 2021-10-22 NOTE — Telephone Encounter (Signed)
Please let her know that it is still not resulted yet. Sometimes, this test can take up to a week. If not back by Wednesday, we will call [facility] to find out why it is taking so long.  Thanks  -HB

## 2021-10-22 NOTE — Telephone Encounter (Signed)
Called pt she is advised that her insurance will not covered the Ozempic

## 2021-10-23 ENCOUNTER — Telehealth: Payer: Self-pay

## 2021-10-23 ENCOUNTER — Encounter: Payer: Self-pay | Admitting: Nurse Practitioner

## 2021-10-23 ENCOUNTER — Other Ambulatory Visit: Payer: Self-pay | Admitting: Nurse Practitioner

## 2021-10-23 ENCOUNTER — Ambulatory Visit
Admission: RE | Admit: 2021-10-23 | Discharge: 2021-10-23 | Disposition: A | Discharge status: Home / Self Care | Payer: No Typology Code available for payment source | Source: Ambulatory Visit | Attending: Nurse Practitioner | Admitting: Nurse Practitioner

## 2021-10-23 DIAGNOSIS — R7612 Nonspecific reaction to cell mediated immunity measurement of gamma interferon antigen response without active tuberculosis: Secondary | ICD-10-CM

## 2021-10-23 NOTE — Progress Notes (Signed)
Chest x-ray ordered at [facility] due to positive quantiferon gold

## 2021-10-23 NOTE — Telephone Encounter (Signed)
Provider is aware she is speaking with the pt right now to discuss options

## 2021-10-23 NOTE — Telephone Encounter (Signed)
Patient called office to see what her next steps would be concerning her TB test results, please advise, thanks

## 2021-10-23 NOTE — Progress Notes (Signed)
New order placed for second quantiferon gold to be done at [place] to her over the phone, advised her of initial results. She understands that chest x-ray has been ordered at [place] and that new lab order placed. If chest x-ray positive, she will require treatment though health department. 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 She plans to contact health at work to inform them of positive results.  She does understand all instructions.

## 2021-10-23 NOTE — Telephone Encounter (Signed)
Called pt she spoke with provider about what steps she needs to start taking going forward

## 2021-10-23 NOTE — Telephone Encounter (Signed)
Called pt she is speaking with the provider right now to see what her option are

## 2021-10-24 ENCOUNTER — Encounter: Payer: Self-pay | Admitting: Nurse Practitioner

## 2021-10-24 NOTE — Progress Notes (Signed)
No evidence of any cardiopulmonary disease. Negative TB chest X-ray

## 2021-10-24 NOTE — Progress Notes (Signed)
Awaiting results on new quantiferon gold test.

## 2021-10-25 ENCOUNTER — Encounter (HOSPITAL_BASED_OUTPATIENT_CLINIC_OR_DEPARTMENT_OTHER): Payer: Self-pay | Admitting: Pharmacist

## 2021-10-25 ENCOUNTER — Other Ambulatory Visit: Payer: Self-pay | Admitting: Nurse Practitioner

## 2021-10-25 ENCOUNTER — Other Ambulatory Visit (HOSPITAL_BASED_OUTPATIENT_CLINIC_OR_DEPARTMENT_OTHER): Payer: Self-pay

## 2021-10-25 DIAGNOSIS — Z6831 Body mass index (BMI) 31.0-31.9, adult: Secondary | ICD-10-CM

## 2021-10-25 MED ORDER — WEGOVY 0.5 MG/0.5ML ~~LOC~~ SOAJ
0.5000 mg | SUBCUTANEOUS | 1 refills | Status: DC
  Filled 2021-10-25 – 2022-01-14 (×3): qty 2, 28d supply, fill #0
  Filled 2022-03-19: qty 2, 28d supply, fill #1

## 2021-10-25 NOTE — Progress Notes (Signed)
Patient's insurance denied coverage for ozempic. Changed this to wegovy 0.5 mg weekly and sent to [facility].

## 2021-10-27 LAB — QUANTIFERON-TB GOLD PLUS
QuantiFERON Mitogen Value: >10 IU/mL
QuantiFERON Nil Value: 0.07 IU/mL
QuantiFERON TB1 Ag Value: 0.42 IU/mL
QuantiFERON TB2 Ag Value: 0.42 IU/mL
QuantiFERON-TB Gold Plus: POSITIVE — AB

## 2021-10-29 ENCOUNTER — Encounter: Payer: Self-pay | Admitting: Nurse Practitioner

## 2021-10-29 ENCOUNTER — Other Ambulatory Visit: Payer: Self-pay | Admitting: Nurse Practitioner

## 2021-10-29 ENCOUNTER — Other Ambulatory Visit (HOSPITAL_BASED_OUTPATIENT_CLINIC_OR_DEPARTMENT_OTHER): Payer: Self-pay

## 2021-10-29 DIAGNOSIS — Z227 Latent tuberculosis: Secondary | ICD-10-CM

## 2021-10-29 NOTE — Progress Notes (Signed)
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

## 2021-10-30 ENCOUNTER — Other Ambulatory Visit (HOSPITAL_BASED_OUTPATIENT_CLINIC_OR_DEPARTMENT_OTHER): Payer: Self-pay

## 2021-11-01 ENCOUNTER — Ambulatory Visit: Payer: No Typology Code available for payment source | Attending: Infectious Diseases | Admitting: Infectious Diseases

## 2021-11-01 ENCOUNTER — Other Ambulatory Visit
Admission: RE | Admit: 2021-11-01 | Discharge: 2021-11-01 | Disposition: A | Discharge status: Home / Self Care | Payer: No Typology Code available for payment source | Attending: Infectious Diseases | Admitting: Infectious Diseases

## 2021-11-01 ENCOUNTER — Other Ambulatory Visit (HOSPITAL_COMMUNITY): Payer: Self-pay

## 2021-11-01 ENCOUNTER — Encounter: Payer: Self-pay | Admitting: Infectious Diseases

## 2021-11-01 VITALS — BP 120/86 | HR 87 | Temp 97.2°F | Ht 65.0 in | Wt 187.0 lb

## 2021-11-01 DIAGNOSIS — R7612 Nonspecific reaction to cell mediated immunity measurement of gamma interferon antigen response without active tuberculosis: Secondary | ICD-10-CM | POA: Insufficient documentation

## 2021-11-01 LAB — COMPREHENSIVE METABOLIC PANEL
ALT: 14 U/L (ref 0–44)
AST: 18 U/L (ref 15–41)
Albumin: 4 g/dL (ref 3.5–5.0)
Alkaline Phosphatase: 77 U/L (ref 38–126)
Anion gap: 6 (ref 5–15)
BUN: 9 mg/dL (ref 6–20)
CO2: 27 mmol/L (ref 22–32)
Calcium: 9.1 mg/dL (ref 8.9–10.3)
Chloride: 106 mmol/L (ref 98–111)
Creatinine, Ser: 0.68 mg/dL (ref 0.44–1.00)
GFR, Estimated: >60 mL/min (ref >60)
Glucose, Bld: 91 mg/dL (ref 70–99)
Potassium: 3.8 mmol/L (ref 3.5–5.1)
Sodium: 139 mmol/L (ref 135–145)
Total Bilirubin: 0.6 mg/dL (ref 0.3–1.2)
Total Protein: 7.4 g/dL (ref 6.5–8.1)

## 2021-11-01 LAB — CBC WITH DIFFERENTIAL/PLATELET
Abs Immature Granulocytes: 0.01 10*3/uL (ref 0.00–0.07)
Basophils Absolute: 0 10*3/uL (ref 0.0–0.1)
Basophils Relative: 1 %
Eosinophils Absolute: 0.3 10*3/uL (ref 0.0–0.5)
Eosinophils Relative: 6 %
HCT: 36.8 % (ref 36.0–46.0)
Hemoglobin: 12.3 g/dL (ref 12.0–15.0)
Immature Granulocytes: 0 %
Lymphocytes Relative: 40 %
Lymphs Abs: 2 10*3/uL (ref 0.7–4.0)
MCH: 26.6 pg (ref 26.0–34.0)
MCHC: 33.4 g/dL (ref 30.0–36.0)
MCV: 79.7 fL — ABNORMAL LOW (ref 80.0–100.0)
Monocytes Absolute: 0.3 10*3/uL (ref 0.1–1.0)
Monocytes Relative: 5 %
Neutro Abs: 2.5 10*3/uL (ref 1.7–7.7)
Neutrophils Relative %: 48 %
Platelets: 262 10*3/uL (ref 150–400)
RBC: 4.62 MIL/uL (ref 3.87–5.11)
RDW: 13.2 % (ref 11.5–15.5)
WBC: 5.1 10*3/uL (ref 4.0–10.5)
nRBC: 0 % (ref 0.0–0.2)

## 2021-11-01 MED ORDER — RIFAMPIN 300 MG PO CAPS
600.0000 mg | ORAL_CAPSULE | Freq: Every day | ORAL | 3 refills | Status: DC
  Filled 2021-11-01: qty 60, 30d supply, fill #0
  Filled 2021-11-27: qty 60, 30d supply, fill #1
  Filled 2021-12-23: qty 60, 30d supply, fill #2
  Filled 2022-01-14 – 2022-01-22 (×2): qty 60, 30d supply, fill #3

## 2021-11-01 NOTE — Patient Instructions (Signed)
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 Will start rifampin '600mg'$  once a day for 4 months

## 2021-11-01 NOTE — Progress Notes (Signed)
NAME: Amanda Banks  DOB: 10-Jun-1990  MRN: [id number]  Date/Time: 11/01/2021 10:04 AM   Subjective:   ? Amanda Banks is a 31 y.o. 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 She wore mask most of the time- she is going to school and for that she had to get a PPD which was neg in jan 2023- instead of a 2nd test she took quant gold blood test 10/17/21 on and it came positive . She repeated the test on 10/23/21 and it was positive. She had a cxr on 10/23/21 and it was negative- She went to health at work and was asked to go to HD She is here for management of the positive test She  has no symptoms like fever, night sweats, chills, weight loss, poor appetite. She wants to lose some weight and used to be on phenterime and now trying to get wegovy She has a nuva ring but she is not compliant   No past medical history on file.  No past surgical history on file.  Social History   Socioeconomic History   Marital status: Single    Spouse name: Not on file   Number of children: Not on file   Years of education: Not on file   Highest education level: Not on file  Occupational History   Not on file  Tobacco Use   Smoking status: Never   Smokeless tobacco: Never  Substance and Sexual Activity   Alcohol use: Never   Drug use: Never   Sexual activity: Yes    Partners: Male  Other Topics Concern   Not on file  Social History Narrative   Not on file   Social Determinants of Health   Financial Resource Strain: Not on file  Food Insecurity: Not on file  Transportation Needs: Not on file  Physical Activity: Not on file  Stress: Not on file  Social Connections: Not on file   Intimate Partner Violence: Not on file    Family History  Problem Relation Age of Onset   Kidney cancer Mother    No Known Allergies I? Current Outpatient Medications  Medication Sig Dispense Refill   etonogestrel-ethinyl estradiol (NUVARING) 0.12-0.015 MG/24HR vaginal ring Insert vaginally and leave in place for 3 consecutive weeks, then remove for 1 week. 1 each 12   fluticasone (FLONASE) 50 MCG/ACT nasal spray Place 2 sprays into both nostrils once daily (shake well before use) 16 g 6   Semaglutide-Weight Management (WEGOVY) 0.5 MG/0.5ML SOAJ Inject 0.5 mg into the skin once a week. 2 mL 1   No current facility-administered medications for this visit.     Abtx:  Anti-infectives (From admission, onward)    None       REVIEW OF SYSTEMS:  Const: negative fever, negative chills, negative weight loss Eyes: negative diplopia or visual changes, negative eye pain ENT: negative coryza, negative sore throat Resp: negative cough, hemoptysis, dyspnea Cards: negative for chest pain, palpitations, lower extremity edema GU: negative for frequency, dysuria and hematuria GI: Negative for abdominal pain, diarrhea, bleeding, constipation Skin: negative for rash and pruritus Heme: negative for easy bruising and gum/nose bleeding  MS: negative for myalgias, arthralgias, back pain and muscle weakness Neurolo:negative for headaches, dizziness, vertigo, memory problems  Psych: negative for feelings of anxiety, depression  Endocrine: negative for thyroid, diabetes Allergy/Immunology- negative for any medication or food allergies  Objective:  VITALS:  BP 120/86   Pulse 87   Temp (!) 97.2 F (36.2 C) (Temporal)   Ht '5\' 5"'$  (1.651 m)   Wt 187 lb (84.8 kg)   BMI 31.12 kg/m    PHYSICAL EXAM:  General: Alert, cooperative, no distress, appears stated age.  Head: Normocephalic, without obvious abnormality, atraumatic. Eyes: Conjunctivae clear, anicteric sclerae. Pupils are equal ENT Nares  normal. No drainage or sinus tenderness. Lips, mucosa, and tongue normal. No Thrush Neck: Supple, symmetrical, no adenopathy, thyroid: non tender no carotid bruit and no JVD. Back: No CVA tenderness. Lungs: Clear to auscultation bilaterally. No Wheezing or Rhonchi. No rales. Heart: Regular rate and rhythm, no murmur, rub or gallop. Abdomen: Soft, non-tender,not distended. Bowel sounds normal. No masses Extremities: atraumatic, no cyanosis. No edema. No clubbing Skin: No rashes or lesions. Or bruising Lymph: Cervical, supraclavicular normal. 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, agricultural , recent positive quantiferon gold with negative PPD in the past CXR neg No active TB Discussed treatment with rifampin '600mg'$  Po Qd X 4 months Other option is once a week INH/rifapentine given at the HD Will get baseline LFT/CBC Explained to her the efficacy of nuva ring will be  reduced with rifampin and will have to use other form of protection  She does not use it regularly Will check with health dept [facility] to see for any potential index case HIV/HEPC neg Feb 2023- she does not have ongoing risk she says Prescription sent for rifampin Side effects explained Will follow up virtually in a few weeks ? ? ___________________________________________________ Discussed with patient in great detail Note:  This document was prepared using Dragon voice recognition software and may include unintentional dictation errors.

## 2021-11-04 DIAGNOSIS — R7303 Prediabetes: Secondary | ICD-10-CM | POA: Insufficient documentation

## 2021-11-05 ENCOUNTER — Encounter: Payer: Self-pay | Admitting: Infectious Diseases

## 2021-11-06 ENCOUNTER — Other Ambulatory Visit: Payer: Self-pay

## 2021-11-06 ENCOUNTER — Encounter: Payer: Self-pay | Admitting: Nurse Practitioner

## 2021-11-06 ENCOUNTER — Other Ambulatory Visit (HOSPITAL_COMMUNITY): Payer: Self-pay

## 2021-11-06 ENCOUNTER — Encounter: Payer: Self-pay | Admitting: Infectious Diseases

## 2021-11-06 ENCOUNTER — Other Ambulatory Visit: Payer: Self-pay | Admitting: Nurse Practitioner

## 2021-11-06 DIAGNOSIS — Z30015 Encounter for initial prescription of vaginal ring hormonal contraceptive: Secondary | ICD-10-CM

## 2021-11-06 DIAGNOSIS — R110 Nausea: Secondary | ICD-10-CM

## 2021-11-06 MED ORDER — ONDANSETRON HCL 4 MG PO TABS
4.0000 mg | ORAL_TABLET | Freq: Three times a day (TID) | ORAL | 1 refills | Status: DC | PRN
  Filled 2021-11-06: qty 45, 8d supply, fill #0
  Filled 2021-12-23: qty 45, 8d supply, fill #1

## 2021-11-06 MED ORDER — ONDANSETRON HCL 4 MG PO TABS
4.0000 mg | ORAL_TABLET | Freq: Three times a day (TID) | ORAL | 1 refills | Status: DC | PRN
  Filled 2021-11-06: qty 45, 8d supply, fill #0

## 2021-11-06 NOTE — Progress Notes (Signed)
Resent zofran prescription to [facility]

## 2021-11-06 NOTE — Progress Notes (Signed)
Zofran 4 mg tablets sent to [facility]. May take 1 to 2 tablets three times daily as needed fr nausea

## 2021-11-07 ENCOUNTER — Other Ambulatory Visit (HOSPITAL_COMMUNITY): Payer: Self-pay

## 2021-11-10 ENCOUNTER — Other Ambulatory Visit (HOSPITAL_BASED_OUTPATIENT_CLINIC_OR_DEPARTMENT_OTHER): Payer: Self-pay

## 2021-11-10 ENCOUNTER — Encounter (HOSPITAL_BASED_OUTPATIENT_CLINIC_OR_DEPARTMENT_OTHER): Payer: Self-pay | Admitting: Pharmacist

## 2021-11-22 ENCOUNTER — Ambulatory Visit: Payer: No Typology Code available for payment source | Attending: Infectious Diseases | Admitting: Infectious Diseases

## 2021-11-22 DIAGNOSIS — R7612 Nonspecific reaction to cell mediated immunity measurement of gamma interferon antigen response without active tuberculosis: Secondary | ICD-10-CM | POA: Insufficient documentation

## 2021-11-22 DIAGNOSIS — Z20822 Contact with and (suspected) exposure to covid-19: Secondary | ICD-10-CM

## 2021-11-22 NOTE — Progress Notes (Signed)
The purpose of this virtual visit is to provide medical care while limiting exposure to the novel coronavirus (COVID19) for both patient and office staff.   Consent was obtained for phone visit:  Yes.   Answered questions that patient had about telehealth interaction:  Yes.   I discussed the limitations, risks, security and privacy concerns of performing an evaluation and management service by telephone. I also discussed with the patient that there may be a patient responsible charge related to this service. The patient expressed understanding and agreed to proceed.   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

## 2021-11-27 ENCOUNTER — Encounter: Payer: Self-pay | Admitting: Nurse Practitioner

## 2021-11-27 ENCOUNTER — Other Ambulatory Visit (HOSPITAL_COMMUNITY): Payer: Self-pay

## 2021-12-24 ENCOUNTER — Other Ambulatory Visit (HOSPITAL_COMMUNITY): Payer: Self-pay

## 2021-12-26 ENCOUNTER — Other Ambulatory Visit: Payer: Self-pay | Admitting: Infectious Diseases

## 2021-12-27 LAB — COMPREHENSIVE METABOLIC PANEL
ALT: 12 IU/L (ref 0–32)
AST: 14 IU/L (ref 0–40)
Albumin/Globulin Ratio: 1.6 (ref 1.2–2.2)
Albumin: 4.5 g/dL (ref 4.0–5.0)
Alkaline Phosphatase: 100 IU/L (ref 44–121)
BUN/Creatinine Ratio: 11 (ref 9–23)
BUN: 8 mg/dL (ref 6–20)
Bilirubin Total: 0.3 mg/dL (ref 0.0–1.2)
CO2: 24 mmol/L (ref 20–29)
Calcium: 9.5 mg/dL (ref 8.7–10.2)
Chloride: 104 mmol/L (ref 96–106)
Creatinine, Ser: 0.72 mg/dL (ref 0.57–1.00)
Globulin, Total: 2.9 g/dL (ref 1.5–4.5)
Glucose: 85 mg/dL (ref 70–99)
Potassium: 4.6 mmol/L (ref 3.5–5.2)
Sodium: 140 mmol/L (ref 134–144)
Total Protein: 7.4 g/dL (ref 6.0–8.5)
eGFR: 116 mL/min/{1.73_m2} (ref >59)

## 2021-12-27 LAB — CBC WITH DIFFERENTIAL/PLATELET
Basophils Absolute: 0 10*3/uL (ref 0.0–0.2)
Basos: 0 %
EOS (ABSOLUTE): 0.4 10*3/uL (ref 0.0–0.4)
Eos: 9 %
Hematocrit: 36.5 % (ref 34.0–46.6)
Hemoglobin: 11.9 g/dL (ref 11.1–15.9)
Immature Grans (Abs): 0 10*3/uL (ref 0.0–0.1)
Immature Granulocytes: 0 %
Lymphocytes Absolute: 1.6 10*3/uL (ref 0.7–3.1)
Lymphs: 36 %
MCH: 26 pg — ABNORMAL LOW (ref 26.6–33.0)
MCHC: 32.6 g/dL (ref 31.5–35.7)
MCV: 80 fL (ref 79–97)
Monocytes Absolute: 0.2 10*3/uL (ref 0.1–0.9)
Monocytes: 5 %
Neutrophils Absolute: 2.2 10*3/uL (ref 1.4–7.0)
Neutrophils: 50 %
Platelets: 295 10*3/uL (ref 150–450)
RBC: 4.57 x10E6/uL (ref 3.77–5.28)
RDW: 13.1 % (ref 11.7–15.4)
WBC: 4.5 10*3/uL (ref 3.4–10.8)

## 2021-12-31 ENCOUNTER — Telehealth: Payer: Self-pay

## 2021-12-31 NOTE — Telephone Encounter (Signed)
-----   Message from Tsosie Billing, MD sent at 12/31/2021 10:28 AM EST ----- Please let her know that her labs look good and whether she is taking rifampin every day? thx ----- Message ----- From: Lavone Neri Lab Results In Sent: 12/31/2021   8:22 AM EST To: Tsosie Billing, MD

## 2022-01-14 ENCOUNTER — Other Ambulatory Visit: Payer: Self-pay

## 2022-01-14 ENCOUNTER — Other Ambulatory Visit (HOSPITAL_COMMUNITY): Payer: Self-pay

## 2022-01-22 ENCOUNTER — Other Ambulatory Visit: Payer: Self-pay

## 2022-01-22 ENCOUNTER — Other Ambulatory Visit (HOSPITAL_COMMUNITY): Payer: Self-pay

## 2022-02-12 ENCOUNTER — Other Ambulatory Visit: Payer: Self-pay

## 2022-02-27 ENCOUNTER — Encounter: Payer: Self-pay | Admitting: Nurse Practitioner

## 2022-02-27 ENCOUNTER — Other Ambulatory Visit (HOSPITAL_COMMUNITY): Payer: Self-pay

## 2022-02-27 ENCOUNTER — Other Ambulatory Visit: Payer: Self-pay

## 2022-02-27 DIAGNOSIS — Z30015 Encounter for initial prescription of vaginal ring hormonal contraceptive: Secondary | ICD-10-CM

## 2022-02-27 MED ORDER — ETONOGESTREL-ETHINYL ESTRADIOL 0.12-0.015 MG/24HR VA RING
VAGINAL_RING | VAGINAL | 12 refills | Status: DC
  Filled 2022-02-27: qty 1, 28d supply, fill #0
  Filled 2022-03-19 – 2022-03-25 (×2): qty 1, 28d supply, fill #1
  Filled 2022-05-17 (×2): qty 1, 28d supply, fill #2
  Filled 2022-06-20: qty 1, 28d supply, fill #3
  Filled 2022-07-16: qty 1, 28d supply, fill #4
  Filled 2022-08-13: qty 1, 28d supply, fill #5
  Filled 2022-08-27 – 2022-09-08 (×2): qty 1, 28d supply, fill #6
  Filled 2022-10-02: qty 1, 28d supply, fill #7
  Filled 2022-10-28: qty 1, 28d supply, fill #8
  Filled 2022-11-27: qty 1, 28d supply, fill #9
  Filled 2022-12-30: qty 1, 28d supply, fill #10
  Filled 2023-01-26: qty 1, 28d supply, fill #11

## 2022-02-28 ENCOUNTER — Other Ambulatory Visit (HOSPITAL_COMMUNITY): Payer: Self-pay

## 2022-03-19 ENCOUNTER — Other Ambulatory Visit: Payer: Self-pay

## 2022-03-19 ENCOUNTER — Other Ambulatory Visit (HOSPITAL_COMMUNITY): Payer: Self-pay

## 2022-03-22 ENCOUNTER — Other Ambulatory Visit (HOSPITAL_COMMUNITY): Payer: Self-pay

## 2022-03-25 ENCOUNTER — Other Ambulatory Visit (HOSPITAL_COMMUNITY): Payer: Self-pay

## 2022-04-11 ENCOUNTER — Other Ambulatory Visit (HOSPITAL_COMMUNITY): Payer: Self-pay

## 2022-05-17 ENCOUNTER — Other Ambulatory Visit (HOSPITAL_COMMUNITY): Payer: Self-pay

## 2022-05-22 ENCOUNTER — Other Ambulatory Visit (HOSPITAL_COMMUNITY): Payer: Self-pay

## 2022-05-23 ENCOUNTER — Other Ambulatory Visit (HOSPITAL_COMMUNITY): Payer: Self-pay

## 2022-05-23 ENCOUNTER — Encounter: Payer: Self-pay | Admitting: Nurse Practitioner

## 2022-05-23 ENCOUNTER — Ambulatory Visit (INDEPENDENT_AMBULATORY_CARE_PROVIDER_SITE_OTHER): Payer: 59 | Admitting: Nurse Practitioner

## 2022-05-23 VITALS — BP 115/80 | HR 86 | Ht 65.0 in | Wt 191.2 lb

## 2022-05-23 DIAGNOSIS — R1031 Right lower quadrant pain: Secondary | ICD-10-CM

## 2022-05-23 DIAGNOSIS — N898 Other specified noninflammatory disorders of vagina: Secondary | ICD-10-CM

## 2022-05-23 DIAGNOSIS — H1013 Acute atopic conjunctivitis, bilateral: Secondary | ICD-10-CM | POA: Diagnosis not present

## 2022-05-23 DIAGNOSIS — Z113 Encounter for screening for infections with a predominantly sexual mode of transmission: Secondary | ICD-10-CM

## 2022-05-23 MED ORDER — OLOPATADINE HCL 0.2 % OP SOLN
1.0000 [drp] | Freq: Every day | OPHTHALMIC | 2 refills | Status: DC
  Filled 2022-05-23: qty 2.5, 50d supply, fill #0

## 2022-05-23 NOTE — Progress Notes (Signed)
Established patient visit   Patient: Amanda Banks   DOB: November 01, 1990   32 y.o. Female  MRN: [id number] Visit Date: 05/23/2022   Chief Complaint  Patient presents with   Groin Pain   Subjective    HPI  Right groin pain  -only on right side  -sometimes crampy, other times sharp and shoots down the right side.  -going on for 2 to 3 weeks.  -pain is intermittent  -denies unusual urinary frequency or urgency  -has noted a clear vaginal discharge.  -does use a nuva ring for birth    Right eye redness  -sometimes itchy and burns.  -has been going on for 2 to 3 days.  Wears contacts.  They are disposable, but monthly, rather than daily.  -She denies chest pain, chest pressure, or shortness of breath. She denies headaches or visual disturbances. She denies abdominal pain, nausea, vomiting, or changes in bowel or bladder habits.    Medications: Outpatient Medications Prior to Visit  Medication Sig   etonogestrel-ethinyl estradiol (NUVARING) 0.12-0.015 MG/24HR vaginal ring Insert vaginally and leave in place for 3 consecutive weeks, then remove for 1 week.   fluticasone (FLONASE) 50 MCG/ACT nasal spray Place 2 sprays into both nostrils once daily (shake well before use)   ondansetron (ZOFRAN) 4 MG tablet Take 1-2 tablets (4-8 mg total) by mouth every 8 (eight) hours as needed for nausea or vomiting.   [DISCONTINUED] rifampin (RIFADIN) 300 MG capsule Take 2 capsules (600 mg total) by mouth daily.   [DISCONTINUED] Semaglutide-Weight Management (WEGOVY) 0.5 MG/0.5ML SOAJ Inject 0.5 mg into the skin once a week.   No facility-administered medications prior to visit.    Review of Systems See HPI    Last CBC Lab Results  Component Value Date   WBC 6.3 05/27/2022   HGB 12.6 05/27/2022   HCT 38.8 05/27/2022   MCV 77 (L) 05/27/2022   MCH 25.0 (L) 05/27/2022   RDW 14.5 05/27/2022   PLT 313 05/27/2022   Last metabolic panel Lab Results  Component Value Date   GLUCOSE 85  05/27/2022   NA 140 05/27/2022   K 4.5 05/27/2022   CL 106 05/27/2022   CO2 17 (L) 05/27/2022   BUN 9 05/27/2022   CREATININE 0.74 05/27/2022   EGFR 112 05/27/2022   CALCIUM 9.1 05/27/2022   PROT 7.2 05/27/2022   ALBUMIN 4.0 05/27/2022   LABGLOB 3.2 05/27/2022   AGRATIO 1.3 05/27/2022   BILITOT 0.4 05/27/2022   ALKPHOS 78 05/27/2022   AST 19 05/27/2022   ALT 10 05/27/2022   ANIONGAP 6 11/01/2021   Last lipids Lab Results  Component Value Date   CHOL 138 01/23/2021   HDL 41 01/23/2021   LDLCALC 77 01/23/2021   TRIG 108 01/23/2021   CHOLHDL 3.4 01/23/2021   Last hemoglobin A1c Lab Results  Component Value Date   HGBA1C 5.2 01/23/2021   Last thyroid functions Lab Results  Component Value Date   TSH 2.780 01/23/2021        Objective     Today's Vitals   05/23/22 1359  BP: 115/80  Pulse: 86  SpO2: 99%  Weight: 191 lb 3.2 oz (86.7 kg)  Height: 5\' 5"  (1.651 m)   Body mass index is 31.82 kg/m.  BP Readings from Last 3 Encounters:  05/23/22 115/80  11/01/21 120/86  10/16/21 96/68    Wt Readings from Last 3 Encounters:  05/23/22 191 lb 3.2 oz (86.7 kg)  11/01/21 187 lb (84.8 kg)  10/16/21  188 lb (85.3 kg)    Physical Exam Vitals and nursing note reviewed.  Constitutional:      Appearance: Normal appearance. She is well-developed.  HENT:     Head: Normocephalic and atraumatic.     Nose: Nose normal.     Mouth/Throat:     Mouth: Mucous membranes are moist.     Pharynx: Oropharynx is clear.  Eyes:     Extraocular Movements: Extraocular movements intact.     Conjunctiva/sclera: Conjunctivae normal.     Pupils: Pupils are equal, round, and reactive to light.  Neck:     Vascular: No carotid bruit.  Cardiovascular:     Rate and Rhythm: Normal rate and regular rhythm.     Pulses: Normal pulses.     Heart sounds: Normal heart sounds.  Pulmonary:     Effort: Pulmonary effort is normal.     Breath sounds: Normal breath sounds.  Abdominal:      Palpations: Abdomen is soft.  Musculoskeletal:        General: Normal range of motion.     Cervical back: Normal range of motion and neck supple.  Lymphadenopathy:     Cervical: No cervical adenopathy.  Skin:    General: Skin is warm and dry.     Capillary Refill: Capillary refill takes less than 2 seconds.  Neurological:     General: No focal deficit present.     Mental Status: She is alert and oriented to person, place, and time.  Psychiatric:        Mood and Affect: Mood normal.        Behavior: Behavior normal.        Thought Content: Thought content normal.        Judgment: Judgment normal.     Results for orders placed or performed in visit on 05/23/22  GC/Chlamydia Probe Amp   Specimen: Urine   Urine  Result Value Ref Range   Chlamydia trachomatis, NAA Negative Negative   Neisseria Gonorrhoeae by PCR Negative Negative  BV+Ct/Ng/Tv NAA+Yeast Culture  Result Value Ref Range   BV, Sialidase Activity CANCELED    Vaginal Yeast Culture CANCELED    Chlamydia by NAA Negative Negative   Gonococcus by NAA Negative Negative   Trich vag by NAA Negative Negative    Assessment & Plan    Right lower quadrant abdominal pain Assessment & Plan: DDX: -ovarian cyst -endometriosis -fibroid tumor -adnexal tumor  Pelvic ultrasound ordered for further evaluation.   Orders: -     US PELVIC COMPLETE WITH TRANSVAGINAL; Future  Allergic conjunctivitis of both eyes Assessment & Plan: Recommend OTC antihistamine such as Zyrtec or Claritin  Trial pataday eye drops - use one day in both eyes daily   Orders: -     Olopatadine HCl; Apply 1 drop to eye daily.  Dispense: 2.5 mL; Refill: 2  Vaginal discharge Assessment & Plan: Testing for STD  Testing for bacterial and yeast vaginitis.  Treat as indicated    Screening for STD (sexually transmitted disease) Assessment & Plan: STD screening ordered today  -treat as indicated   Orders: -     NuSwab Vaginitis Plus (VG+); Future -      Trichomonas vaginalis, RNA; Future -     GC/Chlamydia Probe Amp; Future -     BV+Ct/Ng/Tv NAA+Yeast Culture  Other orders -     BV+Ct/Ng/Tv NAA+Yeast Culture     Return for prn worsening or persistent symptoms. labs monday. include CBC, CMP, HiV, RPR.  Ronnell Freshwater, NP  [facility] at [facility] [phone] (phone) [phone] (fax)  [place]

## 2022-05-26 LAB — BV+CT/NG/TV NAA+YEAST CULTURE
Chlamydia by NAA: NEGATIVE
Gonococcus by NAA: NEGATIVE
Trich vag by NAA: NEGATIVE

## 2022-05-26 LAB — GC/CHLAMYDIA PROBE AMP
Chlamydia trachomatis, NAA: NEGATIVE
Neisseria Gonorrhoeae by PCR: NEGATIVE

## 2022-05-27 ENCOUNTER — Other Ambulatory Visit: Payer: 59

## 2022-05-27 DIAGNOSIS — Z113 Encounter for screening for infections with a predominantly sexual mode of transmission: Secondary | ICD-10-CM | POA: Diagnosis not present

## 2022-05-27 DIAGNOSIS — Z0000 Encounter for general adult medical examination without abnormal findings: Secondary | ICD-10-CM | POA: Diagnosis not present

## 2022-05-27 NOTE — Progress Notes (Signed)
Can you find out why BV and yeast testing cancelled? This time, I know we did it correctly.

## 2022-05-28 LAB — RPR: RPR Ser Ql: NONREACTIVE

## 2022-05-28 LAB — COMPREHENSIVE METABOLIC PANEL
ALT: 10 IU/L (ref 0–32)
AST: 19 IU/L (ref 0–40)
Albumin/Globulin Ratio: 1.3 (ref 1.2–2.2)
Albumin: 4 g/dL (ref 4.0–5.0)
Alkaline Phosphatase: 78 IU/L (ref 44–121)
BUN/Creatinine Ratio: 12 (ref 9–23)
BUN: 9 mg/dL (ref 6–20)
Bilirubin Total: 0.4 mg/dL (ref 0.0–1.2)
CO2: 17 mmol/L — ABNORMAL LOW (ref 20–29)
Calcium: 9.1 mg/dL (ref 8.7–10.2)
Chloride: 106 mmol/L (ref 96–106)
Creatinine, Ser: 0.74 mg/dL (ref 0.57–1.00)
Globulin, Total: 3.2 g/dL (ref 1.5–4.5)
Glucose: 85 mg/dL (ref 70–99)
Potassium: 4.5 mmol/L (ref 3.5–5.2)
Sodium: 140 mmol/L (ref 134–144)
Total Protein: 7.2 g/dL (ref 6.0–8.5)
eGFR: 112 mL/min/{1.73_m2} (ref >59)

## 2022-05-28 LAB — CBC WITH DIFFERENTIAL/PLATELET
Basophils Absolute: 0 10*3/uL (ref 0.0–0.2)
Basos: 1 %
EOS (ABSOLUTE): 0.2 10*3/uL (ref 0.0–0.4)
Eos: 4 %
Hematocrit: 38.8 % (ref 34.0–46.6)
Hemoglobin: 12.6 g/dL (ref 11.1–15.9)
Immature Grans (Abs): 0 10*3/uL (ref 0.0–0.1)
Immature Granulocytes: 0 %
Lymphocytes Absolute: 1.8 10*3/uL (ref 0.7–3.1)
Lymphs: 29 %
MCH: 25 pg — ABNORMAL LOW (ref 26.6–33.0)
MCHC: 32.5 g/dL (ref 31.5–35.7)
MCV: 77 fL — ABNORMAL LOW (ref 79–97)
Monocytes Absolute: 0.3 10*3/uL (ref 0.1–0.9)
Monocytes: 5 %
Neutrophils Absolute: 3.9 10*3/uL (ref 1.4–7.0)
Neutrophils: 61 %
Platelets: 313 10*3/uL (ref 150–450)
RBC: 5.03 x10E6/uL (ref 3.77–5.28)
RDW: 14.5 % (ref 11.7–15.4)
WBC: 6.3 10*3/uL (ref 3.4–10.8)

## 2022-05-28 LAB — HIV ANTIBODY (ROUTINE TESTING W REFLEX): HIV Screen 4th Generation wRfx: NONREACTIVE

## 2022-05-28 NOTE — Progress Notes (Signed)
When she comes in tomorrow, will you let her know that her blood work results were al normal? Thanks

## 2022-05-29 ENCOUNTER — Other Ambulatory Visit: Payer: 59

## 2022-05-29 ENCOUNTER — Other Ambulatory Visit: Payer: Self-pay

## 2022-05-29 DIAGNOSIS — N898 Other specified noninflammatory disorders of vagina: Secondary | ICD-10-CM | POA: Diagnosis not present

## 2022-05-31 LAB — NUSWAB BV AND CANDIDA, NAA
Candida albicans, NAA: NEGATIVE
Candida glabrata, NAA: NEGATIVE
Megasphaera 1: HIGH Score — AB

## 2022-05-31 LAB — SPECIMEN STATUS REPORT

## 2022-06-03 ENCOUNTER — Other Ambulatory Visit (HOSPITAL_COMMUNITY): Payer: Self-pay

## 2022-06-11 ENCOUNTER — Ambulatory Visit
Admission: RE | Admit: 2022-06-11 | Discharge: 2022-06-11 | Disposition: A | Discharge status: Home / Self Care | Payer: 59 | Source: Ambulatory Visit | Attending: Nurse Practitioner | Admitting: Nurse Practitioner

## 2022-06-11 DIAGNOSIS — R1031 Right lower quadrant pain: Secondary | ICD-10-CM

## 2022-06-12 ENCOUNTER — Other Ambulatory Visit: Payer: Self-pay

## 2022-06-12 ENCOUNTER — Other Ambulatory Visit: Payer: Self-pay | Admitting: Nurse Practitioner

## 2022-06-12 ENCOUNTER — Encounter: Payer: Self-pay | Admitting: Nurse Practitioner

## 2022-06-12 ENCOUNTER — Other Ambulatory Visit (HOSPITAL_COMMUNITY): Payer: Self-pay

## 2022-06-12 DIAGNOSIS — R1031 Right lower quadrant pain: Secondary | ICD-10-CM | POA: Insufficient documentation

## 2022-06-12 DIAGNOSIS — N760 Acute vaginitis: Secondary | ICD-10-CM

## 2022-06-12 DIAGNOSIS — H1013 Acute atopic conjunctivitis, bilateral: Secondary | ICD-10-CM | POA: Insufficient documentation

## 2022-06-12 DIAGNOSIS — N898 Other specified noninflammatory disorders of vagina: Secondary | ICD-10-CM | POA: Insufficient documentation

## 2022-06-12 MED ORDER — METRONIDAZOLE 0.75 % VA GEL
1.0000 | Freq: Every day | VAGINAL | 0 refills | Status: DC
  Filled 2022-06-12: qty 70, 7d supply, fill #0

## 2022-06-12 MED ORDER — METRONIDAZOLE 500 MG PO TABS
500.0000 mg | ORAL_TABLET | Freq: Two times a day (BID) | ORAL | 0 refills | Status: DC
  Filled 2022-06-12: qty 14, 7d supply, fill #0

## 2022-06-12 NOTE — Assessment & Plan Note (Signed)
DDX: -ovarian cyst -endometriosis -fibroid tumor -adnexal tumor  Pelvic ultrasound ordered for further evaluation.

## 2022-06-12 NOTE — Assessment & Plan Note (Signed)
STD screening ordered today  -treat as indicated

## 2022-06-12 NOTE — Assessment & Plan Note (Signed)
Testing for STD  Testing for bacterial and yeast vaginitis.  Treat as indicated

## 2022-06-12 NOTE — Assessment & Plan Note (Signed)
Recommend OTC antihistamine such as Zyrtec or Claritin  Trial pataday eye drops - use one day in both eyes daily

## 2022-06-13 ENCOUNTER — Other Ambulatory Visit: Payer: Self-pay

## 2022-06-13 ENCOUNTER — Other Ambulatory Visit (HOSPITAL_COMMUNITY): Payer: Self-pay

## 2022-06-13 ENCOUNTER — Other Ambulatory Visit: Payer: Self-pay | Admitting: Nurse Practitioner

## 2022-06-13 DIAGNOSIS — R1031 Right lower quadrant pain: Secondary | ICD-10-CM

## 2022-06-13 DIAGNOSIS — D259 Leiomyoma of uterus, unspecified: Secondary | ICD-10-CM

## 2022-08-13 ENCOUNTER — Other Ambulatory Visit (HOSPITAL_COMMUNITY): Payer: Self-pay

## 2022-08-27 ENCOUNTER — Other Ambulatory Visit (HOSPITAL_COMMUNITY): Payer: Self-pay

## 2022-09-09 ENCOUNTER — Other Ambulatory Visit (HOSPITAL_COMMUNITY): Payer: Self-pay

## 2022-09-17 ENCOUNTER — Encounter: Payer: Self-pay | Admitting: Family Medicine

## 2022-09-17 ENCOUNTER — Ambulatory Visit (INDEPENDENT_AMBULATORY_CARE_PROVIDER_SITE_OTHER): Payer: 59 | Admitting: Family Medicine

## 2022-09-17 VITALS — BP 112/78 | HR 82 | Resp 18 | Ht 65.0 in | Wt 195.0 lb

## 2022-09-17 DIAGNOSIS — Z13228 Encounter for screening for other metabolic disorders: Secondary | ICD-10-CM

## 2022-09-17 DIAGNOSIS — Z0000 Encounter for general adult medical examination without abnormal findings: Secondary | ICD-10-CM

## 2022-09-17 DIAGNOSIS — R5383 Other fatigue: Secondary | ICD-10-CM | POA: Diagnosis not present

## 2022-09-17 DIAGNOSIS — Z130 Encounter for screening for diseases of the blood and blood-forming organs and certain disorders involving the immune mechanism: Secondary | ICD-10-CM | POA: Diagnosis not present

## 2022-09-17 DIAGNOSIS — R7612 Nonspecific reaction to cell mediated immunity measurement of gamma interferon antigen response without active tuberculosis: Secondary | ICD-10-CM | POA: Diagnosis not present

## 2022-09-17 DIAGNOSIS — Z1321 Encounter for screening for nutritional disorder: Secondary | ICD-10-CM

## 2022-09-17 DIAGNOSIS — Z1329 Encounter for screening for other suspected endocrine disorder: Secondary | ICD-10-CM | POA: Diagnosis not present

## 2022-09-17 DIAGNOSIS — D571 Sickle-cell disease without crisis: Secondary | ICD-10-CM

## 2022-09-17 NOTE — Progress Notes (Signed)
Complete physical exam  Patient: Amanda Banks   DOB: 15-Jun-1990   32 y.o. Female  MRN: [id number]  Subjective:    Chief Complaint  Patient presents with   Annual Exam    Fasting    Amanda Banks is a 32 y.o. female who presents today for a complete physical exam. She reports consuming a general diet. She generally feels fairly well. She reports sleeping fairly well. 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  She does report fatigue and notes that her hemoglobin has been low in the past.  Her mother has told her that she has sickle cell trait, but she has never seen any test results for it.  She would like to have a definitive answer as to whether that is part of her health history.   Most recent fall risk assessment:    05/23/2022    2:03 PM  Fall Risk   Falls in the past year? 0  Number falls in past yr: 0  Injury with Fall? 0  Risk for fall due to : No Fall Risks  Follow up Falls evaluation completed     Most recent depression and anxiety screenings:    09/17/2022    9:20 AM 05/23/2022    2:04 PM  PHQ 2/9 Scores  PHQ - 2 Score 0 0  PHQ- 9 Score 6 0      09/17/2022    9:21 AM 10/16/2021    2:48 PM 07/26/2021    3:33 PM 04/02/2021   10:18 AM  GAD 7 : Generalized Anxiety Score  Nervous, Anxious, on Edge 0 1 0 0  Control/stop worrying 0 0 1 0  Worry too much - different things 0 0 1 0  Trouble relaxing 0 0 0 0  Restless 0 0 0 0  Easily annoyed or irritable 1 1 1  0  Afraid - awful might happen 0 0 0 0  Total GAD 7 Score 1 2 3  0  Anxiety Difficulty Not difficult at all       Patient Active Problem List   Diagnosis Date Noted   Right lower quadrant abdominal pain 06/12/2022   Allergic conjunctivitis of both eyes 06/12/2022   Prediabetes 11/04/2021   Vasomotor rhinitis 08/11/2021   Right shoulder pain 03/04/2021   Pituitary adenoma (HCC) 01/23/2021   Impaired fasting glucose 01/23/2021   BMI 31.0-31.9,adult  01/23/2021    History reviewed. No pertinent surgical history. Social History   Tobacco Use   Smoking status: Never    Passive exposure: Never   Smokeless tobacco: Never  Substance Use Topics   Alcohol use: Never   Drug use: Never   Family History  Problem Relation Age of Onset   Kidney cancer Mother    Cancer Mother    No Known Allergies   Patient Care Team: Melida Quitter, PA as PCP - General (Family Medicine)   Outpatient Medications Prior to Visit  Medication Sig   etonogestrel-ethinyl estradiol (NUVARING) 0.12-0.015 MG/24HR vaginal ring Insert vaginally and leave in place for 3 consecutive weeks, then remove for 1 week.   [DISCONTINUED] fluticasone (FLONASE) 50 MCG/ACT nasal spray Place 2 sprays into both nostrils once daily (shake well before use)   [DISCONTINUED] metroNIDAZOLE (METROGEL) 0.75 % vaginal gel Place 1 Applicatorful vaginally at bedtime.   [DISCONTINUED] Olopatadine HCl 0.2 % SOLN Apply 1 drop to eye daily.   [DISCONTINUED] ondansetron (ZOFRAN) 4 MG tablet Take 1-2 tablets (4-8  mg total) by mouth every 8 (eight) hours as needed for nausea or vomiting.   No facility-administered medications prior to visit.    Review of Systems  Constitutional:  Negative for chills, fever and malaise/fatigue.  HENT:  Negative for congestion and hearing loss.   Eyes:  Negative for blurred vision and double vision.  Respiratory:  Negative for cough and shortness of breath.   Cardiovascular:  Negative for chest pain, palpitations and leg swelling.  Gastrointestinal:  Negative for abdominal pain, constipation, diarrhea and heartburn.  Genitourinary:  Negative for frequency and urgency.  Musculoskeletal:  Negative for myalgias and neck pain.  Neurological:  Negative for headaches.  Endo/Heme/Allergies:  Negative for polydipsia.  Psychiatric/Behavioral:  Negative for depression. The patient is not nervous/anxious and does not have insomnia.       Objective:    BP  112/78 (BP Location: Left Arm, Patient Position: Sitting, Cuff Size: Normal)   Pulse 82   Resp 18   Ht 5\' 5"  (1.651 m)   Wt 195 lb (88.5 kg)   SpO2 98%   BMI 32.45 kg/m    Physical Exam Constitutional:      General: She is not in acute distress.    Appearance: Normal appearance.  HENT:     Head: Normocephalic and atraumatic.     Right Ear: Tympanic membrane, ear canal and external ear normal.     Left Ear: Tympanic membrane, ear canal and external ear normal.     Nose: Nose normal.     Mouth/Throat:     Mouth: Mucous membranes are moist.     Pharynx: No oropharyngeal exudate or posterior oropharyngeal erythema.  Eyes:     Extraocular Movements: Extraocular movements intact.     Conjunctiva/sclera: Conjunctivae normal.     Pupils: Pupils are equal, round, and reactive to light.  Neck:     Thyroid: No thyroid mass, thyromegaly or thyroid tenderness.  Cardiovascular:     Rate and Rhythm: Normal rate and regular rhythm.     Heart sounds: Normal heart sounds. No murmur heard.    No friction rub. No gallop.  Pulmonary:     Effort: Pulmonary effort is normal. No respiratory distress.     Breath sounds: Normal breath sounds. No wheezing, rhonchi or rales.  Abdominal:     General: Abdomen is flat. Bowel sounds are normal. There is no distension.     Palpations: There is no mass.     Tenderness: There is no abdominal tenderness. There is no guarding.  Musculoskeletal:        General: Normal range of motion.     Cervical back: Normal range of motion and neck supple.  Lymphadenopathy:     Cervical: No cervical adenopathy.  Skin:    General: Skin is warm and dry.  Neurological:     Mental Status: She is alert and oriented to person, place, and time.     Cranial Nerves: No cranial nerve deficit.     Motor: No weakness.     Deep Tendon Reflexes: Reflexes normal.  Psychiatric:        Mood and Affect: Mood normal.        Assessment & Plan:    Routine Health Maintenance and  Physical Exam  Immunization History  Administered Date(s) Administered   Influenza,inj,Quad PF,6+ Mos 11/12/2020   Influenza-Unspecified 10/19/2021   PFIZER(Purple Top)SARS-COV-2 Vaccination 09/10/2019, 10/01/2019   PPD Test 02/14/2021   Tdap 10/16/2021    Health Maintenance  Topic Date Due  COVID-19 Vaccine (3 - 2023-24 season) 10/03/2022 (Originally 09/28/2021)   INFLUENZA VACCINE  04/28/2023 (Originally 08/29/2022)   PAP-Cervical Cytology Screening  02/23/2024   PAP SMEAR-Modifier  02/23/2024   DTaP/Tdap/Td (2 - Td or Tdap) 10/17/2031   Hepatitis C Screening  Completed   HIV Screening  Completed   HPV VACCINES  Aged Out   Collecting routine labs today in addition to checking for sickle cell trait/disease.  Once lab results are back, will finish filling out paperwork which requires hemoglobin and A1c results.  Discussed health benefits of physical activity, and encouraged her to engage in regular exercise appropriate for her age and condition.  Wellness examination -     Hemoglobin A1c; Future -     Lipid panel; Future -     TSH Rfx on Abnormal to Free T4; Future -     VITAMIN D 25 Hydroxy (Vit-D Deficiency, Fractures); Future -     Vitamin B12; Future -     Iron and TIBC; Future -     Ferritin; Future -     Sickle cell screen; Future  Screening for endocrine, nutritional, metabolic and immunity disorder -     Hemoglobin A1c; Future -     Lipid panel; Future -     TSH Rfx on Abnormal to Free T4; Future -     VITAMIN D 25 Hydroxy (Vit-D Deficiency, Fractures); Future -     Vitamin B12; Future -     Iron and TIBC; Future -     Ferritin; Future -     Sickle cell screen; Future  Fatigue, unspecified type -     Vitamin B12; Future -     Iron and TIBC; Future -     Ferritin; Future  Screening for sickle-cell disease or trait -     Sickle cell screen; Future  Positive QuantiFERON-TB Gold test -     DG Chest 2 View; Future  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  Return in about 1 year (around 09/17/2023) for annual physical, fasting blood work 1 week before.     Melida Quitter, PA

## 2022-09-18 ENCOUNTER — Other Ambulatory Visit: Payer: Self-pay | Admitting: Family Medicine

## 2022-09-18 ENCOUNTER — Ambulatory Visit
Admission: RE | Admit: 2022-09-18 | Discharge: 2022-09-18 | Disposition: A | Discharge status: Home / Self Care | Payer: 59 | Source: Ambulatory Visit | Attending: Family Medicine | Admitting: Family Medicine

## 2022-09-18 ENCOUNTER — Other Ambulatory Visit (HOSPITAL_COMMUNITY): Payer: Self-pay

## 2022-09-18 DIAGNOSIS — E559 Vitamin D deficiency, unspecified: Secondary | ICD-10-CM

## 2022-09-18 DIAGNOSIS — R7612 Nonspecific reaction to cell mediated immunity measurement of gamma interferon antigen response without active tuberculosis: Secondary | ICD-10-CM | POA: Diagnosis not present

## 2022-09-18 MED ORDER — VITAMIN D (ERGOCALCIFEROL) 1.25 MG (50000 UNIT) PO CAPS
50000.0000 [IU] | ORAL_CAPSULE | ORAL | 0 refills | Status: AC
Start: 2022-09-18 — End: ?
  Filled 2022-09-18: qty 12, 84d supply, fill #0

## 2022-09-19 LAB — VITAMIN B12: Vitamin B-12: 299 pg/mL (ref 232–1245)

## 2022-09-19 LAB — IRON AND TIBC
Iron Saturation: 14 % — ABNORMAL LOW (ref 15–55)
Iron: 78 ug/dL (ref 27–159)
Total Iron Binding Capacity: 538 ug/dL — ABNORMAL HIGH (ref 250–450)
UIBC: 460 ug/dL — ABNORMAL HIGH (ref 131–425)

## 2022-09-19 LAB — HEMOGLOBIN A1C
Est. average glucose Bld gHb Est-mCnc: 111 mg/dL
Hgb A1c MFr Bld: 5.5 % (ref 4.8–5.6)

## 2022-09-19 LAB — LIPID PANEL
Chol/HDL Ratio: 3 ratio (ref 0.0–4.4)
Cholesterol, Total: 152 mg/dL (ref 100–199)
HDL: 50 mg/dL (ref >39)
LDL Chol Calc (NIH): 77 mg/dL (ref 0–99)
Triglycerides: 141 mg/dL (ref 0–149)
VLDL Cholesterol Cal: 25 mg/dL (ref 5–40)

## 2022-09-19 LAB — SICKLE CELL SCREEN: Sickle Cell Screen: POSITIVE — AB

## 2022-09-19 LAB — VITAMIN D 25 HYDROXY (VIT D DEFICIENCY, FRACTURES): Vit D, 25-Hydroxy: 15.2 ng/mL — ABNORMAL LOW (ref 30.0–100.0)

## 2022-09-19 LAB — FERRITIN: Ferritin: 11 ng/mL — ABNORMAL LOW (ref 15–150)

## 2022-09-19 LAB — TSH RFX ON ABNORMAL TO FREE T4: TSH: 1.86 u[IU]/mL (ref 0.450–4.500)

## 2022-09-19 NOTE — Addendum Note (Signed)
Addended by: Saralyn Pilar on: 09/19/2022 05:24 PM   Modules accepted: Orders

## 2022-09-20 ENCOUNTER — Encounter: Payer: Self-pay | Admitting: Family Medicine

## 2022-09-20 ENCOUNTER — Other Ambulatory Visit: Payer: Self-pay | Admitting: *Deleted

## 2022-10-19 ENCOUNTER — Telehealth: Payer: 59 | Admitting: Family Medicine

## 2022-10-19 DIAGNOSIS — R21 Rash and other nonspecific skin eruption: Secondary | ICD-10-CM

## 2022-10-19 MED ORDER — CLOBETASOL PROPIONATE 0.05 % EX OINT
1.0000 | TOPICAL_OINTMENT | Freq: Two times a day (BID) | CUTANEOUS | 0 refills | Status: AC
Start: 2022-10-19 — End: 2022-11-02

## 2022-10-19 NOTE — Progress Notes (Signed)
Virtual Visit Consent   Amanda Banks, you are scheduled for a virtual visit with a [facility] provider today. Just as with appointments in the office, your consent must be obtained to participate. Your consent will be active for this visit and any virtual visit you may have with one of our providers in the next 365 days. If you have a MyChart account, a copy of this consent can be sent to you electronically.  As this is a virtual visit, video technology does not allow for your provider to perform a traditional examination. This may limit your provider's ability to fully assess your condition. If your provider identifies any concerns that need to be evaluated in person or the need to arrange testing (such as labs, EKG, etc.), we will make arrangements to do so. Although advances in technology are sophisticated, we cannot ensure that it will always work on either your end or our end. If the connection with a video visit is poor, the visit may have to be switched to a telephone visit. With either a video or telephone visit, we are not always able to ensure that we have a secure connection.  By engaging in this virtual visit, you consent to the provision of healthcare and authorize for your insurance to be billed (if applicable) for the services provided during this visit. Depending on your insurance coverage, you may receive a charge related to this service.  I need to obtain your verbal consent now. Are you willing to proceed with your visit today? Amanda Banks has provided verbal consent on 10/19/2022 for a virtual visit (video or telephone). Amanda Banks, [place]  Date: 10/19/2022 11:33 AM  Virtual Visit via Video Note   I, Amanda Banks, connected with  Amanda Banks  ([id number], 04/25/1990) on 10/19/22 at 11:30 AM EDT by a video-enabled telemedicine application and verified that I am speaking with the correct person using two identifiers.  Location: Patient: Virtual Visit Location Patient:  Home Provider: Virtual Visit Location Provider: Home Office   I discussed the limitations of evaluation and management by telemedicine and the availability of in person appointments. The patient expressed understanding and agreed to proceed.    History of Present Illness: Amanda Banks is a 32 y.o. who identifies as a female who was assigned female at birth, and is being seen today for c/o I have a rash on her chest.  Pt states she has the rash on her trunk and back.  Pt states she thinks it is Pityriasis.  Pt states rash is itchy and started about a week ago.  Pt states she had left over triamcinolone cream that helped a little.   HPI: HPI  Problems:  Patient Active Problem List   Diagnosis Date Noted   Right lower quadrant abdominal pain 06/12/2022   Allergic conjunctivitis of both eyes 06/12/2022   Prediabetes 11/04/2021   Vasomotor rhinitis 08/11/2021   Right shoulder pain 03/04/2021   Pituitary adenoma (HCC) 01/23/2021   Impaired fasting glucose 01/23/2021   BMI 31.0-31.9,adult 01/23/2021    Allergies: No Known Allergies Medications:  Current Outpatient Medications:    clobetasol ointment (TEMOVATE) 0.05 %, Apply 1 Application topically 2 (two) times daily for 14 days., Disp: 30 g, Rfl: 0   etonogestrel-ethinyl estradiol (NUVARING) 0.12-0.015 MG/24HR vaginal ring, Insert vaginally and leave in place for 3 consecutive weeks, then remove for 1 week., Disp: 1 each, Rfl: 12   Vitamin D, Ergocalciferol, (DRISDOL) 1.25 MG (50000 UNIT) CAPS capsule, Take 1 capsule (50,000 Units  total) by mouth every 7 (seven) days., Disp: 12 capsule, Rfl: 0  Observations/Objective: Patient is well-developed, well-nourished in no acute distress.  Resting comfortably at home.  Head is normocephalic, atraumatic.  No labored breathing.  Speech is clear and coherent with logical content.  Patient is alert and oriented at baseline.  Hyperpigmented area noted to chest    Assessment and Plan: 1. Rash  and nonspecific skin eruption - clobetasol ointment (TEMOVATE) 0.05 %; Apply 1 Application topically 2 (two) times daily for 14 days.  Dispense: 30 g; Refill: 0  -Start Clobetasol ointment -Pt to try over the counter aveno  -Advised Pt to follow up with PCP if symptoms persist  Follow Up Instructions: I discussed the assessment and treatment plan with the patient. The patient was provided an opportunity to ask questions and all were answered. The patient agreed with the plan and demonstrated an understanding of the instructions.  A copy of instructions were sent to the patient via MyChart unless otherwise noted below.     The patient was advised to call back or seek an in-person evaluation if the symptoms worsen or if the condition fails to improve as anticipated.  Time:  I spent 10 minutes with the patient via telehealth technology discussing the above problems/concerns.    Amanda Pandy, PA-C

## 2022-10-19 NOTE — Patient Instructions (Signed)
Amanda Banks, thank you for joining Amanda Pandy, PA-C for today's virtual visit.  While this provider is not your primary care provider (PCP), if your PCP is located in our provider database this encounter information will be shared with them immediately following your visit.   A Amanda Banks MyChart account gives you access to today's visit and all your visits, tests, and labs performed at [facility] " click here if you don't have a [facility] MyChart account or go to [email or web address]  Consent: (Patient) Amanda Banks provided verbal consent for this virtual visit at the beginning of the encounter.  Current Medications:  Current Outpatient Medications:    clobetasol ointment (TEMOVATE) 0.05 %, Apply 1 Application topically 2 (two) times daily for 14 days., Disp: 30 g, Rfl: 0   etonogestrel-ethinyl estradiol (NUVARING) 0.12-0.015 MG/24HR vaginal ring, Insert vaginally and leave in place for 3 consecutive weeks, then remove for 1 week., Disp: 1 each, Rfl: 12   Vitamin D, Ergocalciferol, (DRISDOL) 1.25 MG (50000 UNIT) CAPS capsule, Take 1 capsule (50,000 Units total) by mouth every 7 (seven) days., Disp: 12 capsule, Rfl: 0   Medications ordered in this encounter:  Meds ordered this encounter  Medications   clobetasol ointment (TEMOVATE) 0.05 %    Sig: Apply 1 Application topically 2 (two) times daily for 14 days.    Dispense:  30 g    Refill:  0     *If you need refills on other medications prior to your next appointment, please contact your pharmacy*  Follow-Up: Call back or seek an in-person evaluation if the symptoms worsen or if the condition fails to improve as anticipated.  [facility] [phone]  Other Instructions Rash, Adult A rash is a breakout of spots or blotches on the skin. It can affect the way the skin looks and feels. Many things can cause a rash. Common causes include: Viral infections. These include colds, measles, and  hand, foot, and mouth disease. Bacterial infections. These include scarlet fever and impetigo. Fungal infections. These include athlete's foot, ringworm, and yeast rashes. Skin irritation. This may be from heat rash, exposure to moisture or friction for a long time (intertrigo), or exposure to soap or skin care products (eczema). Allergic reactions. These may be caused by foods, medicines, or things like poison ivy. Some rashes may go away after a few days. Others may last for a few weeks. The goal of treatment is to stop the itching and keep the rash from spreading. Follow these instructions at home: Medicine Take or apply over-the-counter and prescription medicines only as told by your health care provider. These may include: Corticosteroids. These can help treat red or swollen skin. They may be given as creams or as medicines to take by mouth (orally). Anti-itch lotions. Allergy medicines. Pain medicine. Antifungal medicine if the rash is from a fungal infection. Antibiotics if you have an infection.  Skin care Apply cool, wet cloths (compresses) to the affected areas. Do not scratch or rub your skin. Avoid covering the rash. Keep it exposed to air as often as you can. Managing itching and discomfort Avoid hot showers and baths. These can make itching worse. A cold shower may help. Try taking a bath with: Epsom salts. You can get these at your local pharmacy or grocery store. Follow the instructions on the package. Baking soda. Pour a small amount into the bath as told by your provider. Colloidal oatmeal. You can get this at your local pharmacy or grocery  store. Follow the instructions on the package. Try putting baking soda paste on your skin. Stir water into baking soda until it becomes like a paste. Try using calamine lotion or cortisone cream to help with itchiness. Keep cool. Stay out of the sun. Sweating and being hot can make itching worse. General instructions  Rest as  needed. Drink enough fluid to keep your pee (urine) pale yellow. Wear loose-fitting clothes. Avoid scented soaps, detergents, and perfumes. Use gentle soaps, detergents, perfumes, and cosmetics. Avoid the things that cause your rash (triggers). Keep a journal to help keep track of your triggers. Write down: What you eat. What cosmetics you use. What you drink. What you wear. This includes jewelry. Contact a health care provider if: You sweat at night more than normal. You pee (urinate) more or less than normal, or your pee is a darker color than normal. Your eyes become sensitive to light. Your skin or the white parts of your eyes turn yellow (jaundice). Your skin tingles or is numb. You get painful blisters in your nose or mouth. Your rash does not go away after a few days, or it gets worse. You are more tired or thirsty than normal. You have new or worse symptoms. These may include: Pain in your abdomen. Fever. Diarrhea or vomiting. Weakness or weight loss. Get help right away if: You get confused. You have a severe headache, a stiff neck, or severe joint pain or stiffness. You become very sleepy or not responsive. You have a seizure. This information is not intended to replace advice given to you by your health care provider. Make sure you discuss any questions you have with your health care provider. Document Revised: 11/02/2021 Document Reviewed: 11/02/2021 Elsevier Patient Education  2024 Elsevier Inc.    If you have been instructed to have an in-person evaluation today at a local Urgent Care facility, please use the link below. It will take you to a list of all of our available [facility], including address, phone number and hours of operation. Please do not delay care.  [facility]  If you or a family member do not have a primary care provider, use the link below to schedule a visit and establish care. When you choose a [facility] primary care  physician or advanced practice provider, you gain a long-term partner in health. Find a Primary Care Provider  Learn more about [facility] in-office and virtual care options: [facility] - Get Care Now

## 2022-10-28 ENCOUNTER — Encounter: Payer: Self-pay | Admitting: Family Medicine

## 2022-10-28 ENCOUNTER — Other Ambulatory Visit: Payer: Self-pay | Admitting: Family Medicine

## 2022-10-28 DIAGNOSIS — D582 Other hemoglobinopathies: Secondary | ICD-10-CM | POA: Insufficient documentation

## 2022-12-30 ENCOUNTER — Telehealth: Payer: 59 | Admitting: Physician Assistant

## 2022-12-30 ENCOUNTER — Other Ambulatory Visit (HOSPITAL_COMMUNITY): Payer: Self-pay

## 2022-12-30 DIAGNOSIS — N939 Abnormal uterine and vaginal bleeding, unspecified: Secondary | ICD-10-CM

## 2022-12-30 DIAGNOSIS — D219 Benign neoplasm of connective and other soft tissue, unspecified: Secondary | ICD-10-CM

## 2022-12-30 NOTE — Progress Notes (Signed)
Because of having prolonged vaginal bleeding and fibroid history,  I feel your condition warrants further evaluation and I recommend that you be seen in a face to face visit with your gynecologist or at one of our [facility] clinics.   NOTE: There will be NO CHARGE for this eVisit   If you are having a true medical emergency please call 911.    *Center for [facility]             [address] [place], [place] [address] [phone] (*Take patients with no insurance)  *Center for [employer] [facility] at [facility] [address] [place], [place],  [place]  [address] [phone] (*Take patients with no insurance)  Center for [employer] at [facility]                                                             [address], [address], [place], [place], [address] [phone]  Center for [facility] at [facility] [address], [place], [place], [address] [phone]  Center for [facility] at [facility] [address], [place], [place], [address] [phone]  Center for [facility] at [facility]                                 [address] [place], [place], [place], [address] [phone]  Center for [facility]                                    [address], [place], [place], [address] [phone]  Center for [facility] at [facility] [address], [place], [place], [address]                              [facility] [address], [place], [place], [address] [phone]  Your MyChart E-visit questionnaire answers were reviewed by a board certified advanced clinical practitioner to complete your personal care plan based on your specific symptoms.  Thank you for using e-Visits.   I have spent 5 minutes in review of e-visit questionnaire, review and updating patient chart,  medical decision making and response to patient.   Margaretann Loveless, PA-C

## 2023-01-27 ENCOUNTER — Other Ambulatory Visit (HOSPITAL_COMMUNITY): Payer: Self-pay

## 2023-01-27 ENCOUNTER — Other Ambulatory Visit: Payer: Self-pay

## 2023-02-27 ENCOUNTER — Telehealth: Payer: Commercial Managed Care - PPO | Admitting: Physician Assistant

## 2023-02-27 DIAGNOSIS — J019 Acute sinusitis, unspecified: Secondary | ICD-10-CM

## 2023-02-27 DIAGNOSIS — B9689 Other specified bacterial agents as the cause of diseases classified elsewhere: Secondary | ICD-10-CM

## 2023-02-27 MED ORDER — BENZONATATE 100 MG PO CAPS
100.0000 mg | ORAL_CAPSULE | Freq: Three times a day (TID) | ORAL | 0 refills | Status: AC | PRN
Start: 2023-02-27 — End: ?

## 2023-02-27 MED ORDER — AMOXICILLIN-POT CLAVULANATE 875-125 MG PO TABS
1.0000 | ORAL_TABLET | Freq: Two times a day (BID) | ORAL | 0 refills | Status: AC
Start: 2023-02-27 — End: ?

## 2023-02-27 NOTE — Progress Notes (Signed)
E-Visit for Sinus Problems  We are sorry that you are not feeling well.  Here is how we plan to help!  Based on what you have shared with me it looks like you have sinusitis.  Sinusitis is inflammation and infection in the sinus cavities of the head.  Based on your presentation I believe you most likely have Acute Bacterial Sinusitis.  This is an infection caused by bacteria and is treated with antibiotics. I have prescribed Augmentin 875mg /125mg  one tablet twice daily with food, for 7 days. I have also sent in a prescription cough medication, Tessalon, to take as directed. You may use an oral decongestant such as Mucinex D or if you have glaucoma or high blood pressure use plain Mucinex. Saline nasal spray help and can safely be used as often as needed for congestion.  If you develop worsening sinus pain, fever or notice severe headache and vision changes, or if symptoms are not better after completion of antibiotic, please schedule an appointment with a health care provider.    Sinus infections are not as easily transmitted as other respiratory infection, however we still recommend that you avoid close contact with loved ones, especially the very young and elderly.  Remember to wash your hands thoroughly throughout the day as this is the number one way to prevent the spread of infection!  Home Care: Only take medications as instructed by your medical team. Complete the entire course of an antibiotic. Do not take these medications with alcohol. A steam or ultrasonic humidifier can help congestion.  You can place a towel over your head and breathe in the steam from hot water coming from a faucet. Avoid close contacts especially the very young and the elderly. Cover your mouth when you cough or sneeze. Always remember to wash your hands.  Get Help Right Away If: You develop worsening fever or sinus pain. You develop a severe head ache or visual changes. Your symptoms persist after you have  completed your treatment plan.  Make sure you Understand these instructions. Will watch your condition. Will get help right away if you are not doing well or get worse.  Thank you for choosing an e-visit.  Your e-visit answers were reviewed by a board certified advanced clinical practitioner to complete your personal care plan. Depending upon the condition, your plan could have included both over the counter or prescription medications.  Please review your pharmacy choice. Make sure the pharmacy is open so you can pick up prescription now. If there is a problem, you may contact your provider through [employer] and have the prescription routed to another pharmacy.  Your safety is important to [place]. If you have drug allergies check your prescription carefully.   For the next 24 hours you can use MyChart to ask questions about today's visit, request a non-urgent call back, or ask for a work or school excuse. You will get an email in the next two days asking about your experience. I hope that your e-visit has been valuable and will speed your recovery.

## 2023-02-27 NOTE — Progress Notes (Signed)
I have spent 5 minutes in review of e-visit questionnaire, review and updating patient chart, medical decision making and response to patient.   Piedad Climes, PA-C

## 2023-03-06 ENCOUNTER — Encounter: Payer: Self-pay | Admitting: Family Medicine

## 2023-03-18 ENCOUNTER — Ambulatory Visit (INDEPENDENT_AMBULATORY_CARE_PROVIDER_SITE_OTHER): Payer: Commercial Managed Care - PPO | Admitting: Family Medicine

## 2023-03-18 VITALS — BP 102/67 | HR 69 | Ht 65.0 in | Wt 207.0 lb

## 2023-03-18 DIAGNOSIS — R052 Subacute cough: Secondary | ICD-10-CM | POA: Diagnosis not present

## 2023-03-18 DIAGNOSIS — R059 Cough, unspecified: Secondary | ICD-10-CM | POA: Insufficient documentation

## 2023-03-18 MED ORDER — BUDESONIDE-FORMOTEROL FUMARATE 80-4.5 MCG/ACT IN AERO: 2.0000 | INHALATION_SPRAY | Freq: Two times a day (BID) | RESPIRATORY_TRACT | 3 refills | Status: AC

## 2023-03-18 MED ORDER — AZITHROMYCIN 500 MG PO TABS: 500.0000 mg | ORAL_TABLET | Freq: Every day | ORAL | 0 refills | Status: AC

## 2023-03-18 NOTE — Assessment & Plan Note (Signed)
 Several weeks of cough that has not improved.  Given her history of a preceding upper respiratory symptoms, followed by coughing fits that can lead to posttussive emesis, there is a good chance she has pertussis infection.  Will prescribe azithromycin x 5 days but also advised her that her cough may still linger for several weeks.  Other possibility includes cough variant asthma given her history of allergies and complaints of worsening cough at night and with exertion.  On exam there was no wheezing.  Prescribed Symbicort but advised her to wait at least another 10 days to see if all symptoms are improving and if they are not improving at all she can start the Symbicort.

## 2023-03-18 NOTE — Progress Notes (Signed)
   Acute Office Visit  Subjective:     Patient ID: Amanda Banks, female    DOB: 16-Jul-1990, 33 y.o.   MRN: [id number]  Chief Complaint  Patient presents with   Cough    HPI Patient is in today for cough.  Patient states that approximately 3 weeks ago she developed upper respiratory symptoms including nasal congestion, sinus pressure.  These improved but then she developed a cough that she states "would not go away".  Cough is so severe at times that she has posttussive emesis approximately once a day.  She has been taking over-the-counter medications including Mucinex, Tessalon Perles, Robitussin and Flonase and daily second-generation antihistamine.  None of this has helped her symptoms.  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  On the 30th she saw a urgent care via telemedicine and was prescribed Augmentin for "bacterial sinusitis."  She was unsure about taking this and only ended up taking 1 pill.  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  ROS      Objective:    BP 102/67   Pulse 69   Ht 5\' 5"  (1.651 m)   Wt 207 lb (93.9 kg)   BMI 34.45 kg/m    Physical Exam General: Alert, oriented CV: Regular rate and rhythm Pulmonary: Lungs clear bilaterally, no wheezing or crackles.  No results found for any visits on 03/18/23.      Assessment & Plan:   Subacute cough Assessment & Plan: Several weeks of cough that has not improved.  Given her history of a preceding upper respiratory symptoms, followed by coughing fits that can lead to posttussive emesis, there is a good chance she has pertussis infection.  Will prescribe azithromycin x 5 days but also advised her that her cough may still linger for several weeks.  Other possibility includes cough variant asthma given her history of allergies and complaints of worsening cough at night and with  exertion.  On exam there was no wheezing.  Prescribed Symbicort but advised her to wait at least another 10 days to see if all symptoms are improving and if they are not improving at all she can start the Symbicort.   Other orders -     Azithromycin; Take 1 tablet (500 mg total) by mouth daily for 5 days.  Dispense: 5 tablet; Refill: 0 -     Budesonide-Formoterol Fumarate; Inhale 2 puffs into the lungs 2 (two) times daily.  Dispense: 1 each; Refill: 3     Return if symptoms worsen or fail to improve.  Sandre Kitty, MD

## 2023-03-18 NOTE — Patient Instructions (Addendum)
 It was nice to see you today,  We addressed the following topics today: -I believe your cough is most likely due to something called pertussis which is a bacterial infection that can cause a cough that can last for 2 or more months..  I will send in a prescription for azithromycin to take for 5 days, but your cough may still linger for several more weeks even after taking the medication.  Continue your over-the-counter treatments as well - I have also sent in a prescription for Symbicort, but I would hold off on using this unless you have not had any improvement after taking the antibiotic and you have waited at least 10 more days to see if your symptoms are improving.  I think it is less likely to be cough variant asthma but it is still a possibility given that you have allergy history as well.  Have a great day,  Frederic Jericho, MD

## 2023-04-01 ENCOUNTER — Other Ambulatory Visit (HOSPITAL_COMMUNITY): Payer: Self-pay

## 2023-04-01 ENCOUNTER — Other Ambulatory Visit: Payer: Self-pay | Admitting: Nurse Practitioner

## 2023-04-01 DIAGNOSIS — Z30015 Encounter for initial prescription of vaginal ring hormonal contraceptive: Secondary | ICD-10-CM

## 2023-04-01 MED ORDER — ETONOGESTREL-ETHINYL ESTRADIOL 0.12-0.015 MG/24HR VA RING
VAGINAL_RING | VAGINAL | 3 refills | Status: DC
  Filled 2023-04-01: qty 3, 84d supply, fill #0

## 2023-04-01 NOTE — Telephone Encounter (Signed)
 I need a refill of NuvaRing sent to [facility]

## 2023-04-01 NOTE — Addendum Note (Signed)
 Addended by: Saralyn Pilar on: 04/01/2023 12:00 PM   Modules accepted: Orders

## 2023-04-02 ENCOUNTER — Encounter: Payer: Self-pay | Admitting: Family Medicine

## 2023-04-02 DIAGNOSIS — Z30015 Encounter for initial prescription of vaginal ring hormonal contraceptive: Secondary | ICD-10-CM

## 2023-04-03 MED ORDER — ETONOGESTREL-ETHINYL ESTRADIOL 0.12-0.015 MG/24HR VA RING
VAGINAL_RING | VAGINAL | 5 refills | Status: AC
Start: 2023-04-03 — End: ?

## 2023-04-04 ENCOUNTER — Other Ambulatory Visit (HOSPITAL_COMMUNITY): Payer: Self-pay

## 2023-04-10 ENCOUNTER — Other Ambulatory Visit (HOSPITAL_COMMUNITY): Payer: Self-pay

## 2023-08-27 ENCOUNTER — Other Ambulatory Visit: Payer: Self-pay | Admitting: Family Medicine

## 2023-08-27 ENCOUNTER — Ambulatory Visit: Payer: Self-pay | Admitting: Nurse Practitioner

## 2023-08-27 DIAGNOSIS — Z30015 Encounter for initial prescription of vaginal ring hormonal contraceptive: Secondary | ICD-10-CM

## 2023-09-17 ENCOUNTER — Encounter: Payer: 59 | Admitting: Family Medicine
